# Patient Record
Sex: Male | Born: 1945 | Race: White | Hispanic: No
Health system: Southern US, Community
[De-identification: ages and names within clinical notes are randomized; demographics above are authoritative.]

## PROBLEM LIST (undated history)

## (undated) DIAGNOSIS — Z930 Tracheostomy status: Secondary | ICD-10-CM

## (undated) DIAGNOSIS — J9621 Acute and chronic respiratory failure with hypoxia: Secondary | ICD-10-CM

## (undated) DIAGNOSIS — F05 Delirium due to known physiological condition: Secondary | ICD-10-CM

## (undated) DIAGNOSIS — J15211 Pneumonia due to Methicillin susceptible Staphylococcus aureus: Secondary | ICD-10-CM

## (undated) DIAGNOSIS — I63549 Cerebral infarction due to unspecified occlusion or stenosis of unspecified cerebellar artery: Secondary | ICD-10-CM

---

## 2019-11-27 ENCOUNTER — Other Ambulatory Visit (HOSPITAL_COMMUNITY): Payer: Medicare Other

## 2019-11-27 ENCOUNTER — Inpatient Hospital Stay: Admission: RE | Admit: 2019-11-27 | Discharge: 2020-01-26 | Disposition: E | Payer: Medicare Other

## 2019-11-27 DIAGNOSIS — J9621 Acute and chronic respiratory failure with hypoxia: Secondary | ICD-10-CM | POA: Diagnosis present

## 2019-11-27 DIAGNOSIS — J9690 Respiratory failure, unspecified, unspecified whether with hypoxia or hypercapnia: Secondary | ICD-10-CM

## 2019-11-27 DIAGNOSIS — T17908A Unspecified foreign body in respiratory tract, part unspecified causing other injury, initial encounter: Secondary | ICD-10-CM

## 2019-11-27 DIAGNOSIS — J189 Pneumonia, unspecified organism: Secondary | ICD-10-CM

## 2019-11-27 DIAGNOSIS — I63549 Cerebral infarction due to unspecified occlusion or stenosis of unspecified cerebellar artery: Secondary | ICD-10-CM | POA: Diagnosis present

## 2019-11-27 DIAGNOSIS — Z9911 Dependence on respirator [ventilator] status: Secondary | ICD-10-CM

## 2019-11-27 DIAGNOSIS — Z931 Gastrostomy status: Secondary | ICD-10-CM

## 2019-11-27 DIAGNOSIS — Z930 Tracheostomy status: Secondary | ICD-10-CM

## 2019-11-27 DIAGNOSIS — J15211 Pneumonia due to Methicillin susceptible Staphylococcus aureus: Secondary | ICD-10-CM | POA: Diagnosis present

## 2019-11-27 DIAGNOSIS — F05 Delirium due to known physiological condition: Secondary | ICD-10-CM | POA: Diagnosis present

## 2019-11-27 HISTORY — DX: Tracheostomy status: Z93.0

## 2019-11-27 HISTORY — DX: Delirium due to known physiological condition: F05

## 2019-11-27 HISTORY — DX: Acute and chronic respiratory failure with hypoxia: J96.21

## 2019-11-27 HISTORY — DX: Cerebral infarction due to unspecified occlusion or stenosis of unspecified cerebellar artery: I63.549

## 2019-11-27 HISTORY — DX: Pneumonia due to methicillin susceptible Staphylococcus aureus: J15.211

## 2019-11-27 MED ORDER — IOHEXOL 300 MG/ML  SOLN
50.0000 mL | Freq: Once | INTRAMUSCULAR | Status: AC | PRN
  Administered 2019-11-27: 50 mL

## 2019-11-28 DIAGNOSIS — J15211 Pneumonia due to Methicillin susceptible Staphylococcus aureus: Secondary | ICD-10-CM | POA: Diagnosis present

## 2019-11-28 DIAGNOSIS — J9621 Acute and chronic respiratory failure with hypoxia: Secondary | ICD-10-CM | POA: Diagnosis not present

## 2019-11-28 DIAGNOSIS — I63549 Cerebral infarction due to unspecified occlusion or stenosis of unspecified cerebellar artery: Secondary | ICD-10-CM | POA: Diagnosis not present

## 2019-11-28 DIAGNOSIS — F05 Delirium due to known physiological condition: Secondary | ICD-10-CM

## 2019-11-28 DIAGNOSIS — Z930 Tracheostomy status: Secondary | ICD-10-CM

## 2019-11-28 LAB — COMPREHENSIVE METABOLIC PANEL
ALT: 26 U/L (ref 0–44)
AST: 28 U/L (ref 15–41)
Albumin: 2.8 g/dL — ABNORMAL LOW (ref 3.5–5.0)
Alkaline Phosphatase: 53 U/L (ref 38–126)
Anion gap: 9 (ref 5–15)
BUN: 33 mg/dL — ABNORMAL HIGH (ref 8–23)
CO2: 29 mmol/L (ref 22–32)
Calcium: 9 mg/dL (ref 8.9–10.3)
Chloride: 102 mmol/L (ref 98–111)
Creatinine, Ser: 0.56 mg/dL — ABNORMAL LOW (ref 0.61–1.24)
GFR calc Af Amer: >60 mL/min (ref >60)
GFR calc non Af Amer: >60 mL/min (ref >60)
Glucose, Bld: 124 mg/dL — ABNORMAL HIGH (ref 70–99)
Potassium: 4.6 mmol/L (ref 3.5–5.1)
Sodium: 140 mmol/L (ref 135–145)
Total Bilirubin: 1.1 mg/dL (ref 0.3–1.2)
Total Protein: 6.9 g/dL (ref 6.5–8.1)

## 2019-11-28 LAB — URINALYSIS, ROUTINE W REFLEX MICROSCOPIC
Bilirubin Urine: NEGATIVE
Glucose, UA: NEGATIVE mg/dL
Ketones, ur: NEGATIVE mg/dL
Nitrite: POSITIVE — AB
Protein, ur: 100 mg/dL — AB
RBC / HPF: >50 RBC/hpf — ABNORMAL HIGH (ref 0–5)
Specific Gravity, Urine: 1.025 (ref 1.005–1.030)
WBC, UA: >50 WBC/hpf — ABNORMAL HIGH (ref 0–5)
pH: 6 (ref 5.0–8.0)

## 2019-11-28 LAB — CBC
HCT: 33.6 % — ABNORMAL LOW (ref 39.0–52.0)
Hemoglobin: 10.6 g/dL — ABNORMAL LOW (ref 13.0–17.0)
MCH: 30.5 pg (ref 26.0–34.0)
MCHC: 31.5 g/dL (ref 30.0–36.0)
MCV: 96.8 fL (ref 80.0–100.0)
Platelets: 292 10*3/uL (ref 150–400)
RBC: 3.47 MIL/uL — ABNORMAL LOW (ref 4.22–5.81)
RDW: 16.6 % — ABNORMAL HIGH (ref 11.5–15.5)
WBC: 13.1 10*3/uL — ABNORMAL HIGH (ref 4.0–10.5)
nRBC: 0 % (ref 0.0–0.2)

## 2019-11-28 LAB — BLOOD GAS, ARTERIAL
Acid-Base Excess: 5.9 mmol/L — ABNORMAL HIGH (ref 0.0–2.0)
Bicarbonate: 30.1 mmol/L — ABNORMAL HIGH (ref 20.0–28.0)
FIO2: 40
O2 Saturation: 98.6 %
Patient temperature: 37
pCO2 arterial: 45.5 mmHg (ref 32.0–48.0)
pH, Arterial: 7.436 (ref 7.350–7.450)
pO2, Arterial: 97.3 mmHg (ref 83.0–108.0)

## 2019-11-28 LAB — TSH: TSH: 3.054 u[IU]/mL (ref 0.350–4.500)

## 2019-11-28 MED ORDER — GENERIC EXTERNAL MEDICATION
5.00 | Status: DC
Start: ? — End: 2019-11-28

## 2019-11-28 MED ORDER — VALPROIC ACID 250 MG/5ML PO SOLN
500.00 | ORAL | Status: DC
Start: 2019-11-27 — End: 2019-11-28

## 2019-11-28 MED ORDER — CHLORHEXIDINE GLUCONATE 0.12 % MT SOLN
5.00 | OROMUCOSAL | Status: DC
Start: 2019-11-27 — End: 2019-11-28

## 2019-11-28 MED ORDER — ALBUTEROL SULFATE (2.5 MG/3ML) 0.083% IN NEBU
2.50 | INHALATION_SOLUTION | RESPIRATORY_TRACT | Status: DC
Start: ? — End: 2019-11-28

## 2019-11-28 MED ORDER — ASPIRIN 81 MG PO CHEW
81.00 | CHEWABLE_TABLET | ORAL | Status: DC
Start: 2019-11-28 — End: 2019-11-28

## 2019-11-28 MED ORDER — MELATONIN 3 MG PO TABS
6.00 | ORAL_TABLET | ORAL | Status: DC
Start: 2019-11-28 — End: 2019-11-28

## 2019-11-28 MED ORDER — SENNOSIDES 8.8 MG/5ML PO SYRP
10.00 | ORAL_SOLUTION | ORAL | Status: DC
Start: 2019-11-27 — End: 2019-11-28

## 2019-11-28 MED ORDER — OLANZAPINE 7.5 MG PO TABS
15.00 | ORAL_TABLET | ORAL | Status: DC
Start: 2019-11-28 — End: 2019-11-28

## 2019-11-28 MED ORDER — TRAZODONE HCL 50 MG PO TABS
75.00 | ORAL_TABLET | ORAL | Status: DC
Start: 2019-11-27 — End: 2019-11-28

## 2019-11-28 MED ORDER — OLANZAPINE 5 MG PO TABS
5.00 | ORAL_TABLET | ORAL | Status: DC
Start: 2019-11-28 — End: 2019-11-28

## 2019-11-28 MED ORDER — POLYETHYLENE GLYCOL 3350 17 GM/SCOOP PO POWD
17.00 | ORAL | Status: DC
Start: 2019-11-28 — End: 2019-11-28

## 2019-11-28 MED ORDER — TAMSULOSIN HCL 0.4 MG PO CAPS
0.40 | ORAL_CAPSULE | ORAL | Status: DC
Start: 2019-11-27 — End: 2019-11-28

## 2019-11-28 MED ORDER — ENOXAPARIN SODIUM 40 MG/0.4ML ~~LOC~~ SOLN
40.00 | SUBCUTANEOUS | Status: DC
Start: 2019-11-28 — End: 2019-11-28

## 2019-11-28 MED ORDER — HYDROMORPHONE HCL 1 MG/ML IJ SOLN
1.00 | INTRAMUSCULAR | Status: DC
Start: ? — End: 2019-11-28

## 2019-11-28 MED ORDER — ATORVASTATIN CALCIUM 80 MG PO TABS
80.00 | ORAL_TABLET | ORAL | Status: DC
Start: 2019-11-28 — End: 2019-11-28

## 2019-11-28 NOTE — Consult Note (Signed)
Pulmonary [place]  Date of Service: 11/28/2019  PULMONARY CRITICAL CARE Melvin Howard  TMA:[id number]  DOB: 1946-07-21   DOA: 12-26-2019  Referring Physician: Merton Border, MD  HPI: Melvin Howard is a 74 y.o. male seen for follow up of Acute on Chronic Respiratory Failure.  Patient has multiple medical problems including history of stroke cerebellar infarction pneumonia respiratory failure tracheostomy acute renal failure who was admitted to [facility] with projectile vomiting severe headache.  Patient arrived into the ED with dysphagia dysarthria on waking.  Patient also was noted to have significant ataxia of his instability of his gait.  Was evaluated with a CT and showed a large left cerebellar infarction.  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  Patient was admitted to the neurosurgical ICU and was started on aspirin.  Hospital course patient was intubated and placed on the ventilator had a tracheostomy done on December 7 patient had initially been intubated for airway protection but did not tolerate any weaning trials subsequently.  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  ENT did a biopsy was found to have benign mucosa.  Other complications included development of MSSA pneumonia and tracheobronchitis patient also had been noted to be hypotensive.  Chest x-ray had shown opacification on the presentation of the pneumonia cultures are growing 3+ MSSA pneumonia.  Patient also suffered an acute renal failure along with hypernatremia which apparently did improve.  Other issues have been behavior always been having multifocal delirium agitation and also there is a concern that he may have possibility of dementia.  He is now transferred here for further management and possibility of weaning.   At the time he was evaluated patient is obviously confused and agitated and is on the ventilator on 40% FiO2.  Review of Systems:  ROS performed and is unremarkable other than noted above.  Past medical history: Acute stroke involving cerebellum Tracheostomy Respiratory failure Delirium agitation Pneumonia  Past surgical history: Tracheostomy PEG placement  Social history: Unknown if tobacco smoker or alcohol abuse  Family history: Noncontributory   Medications: Reviewed on Rounds  Physical Exam:  Vitals: Temperature 98.9 pulse 81 respiratory rate 13 blood pressure is 118/73 saturations 98%  Ventilator Settings Mode of ventilation assist control FiO2 40% tidal volume 540 PEEP 7  . General: Comfortable at this time . Eyes: Grossly normal lids, irises & conjunctiva . ENT: grossly tongue is normal . Neck: no obvious mass . Cardiovascular: S1-S2 normal no gallop or rub . Respiratory: No rhonchi no rales are noted at this time . Abdomen: Soft and nontender . Skin: no rash seen on limited exam . Musculoskeletal: not rigid . Psychiatric:unable to assess . Neurologic: no seizure no involuntary movements         Labs on Admission:  Basic Metabolic Panel: Recent Labs  Lab December 26, 2019 2341  NA 140  K 4.6  CL 102  CO2 29  GLUCOSE 124*  BUN 33*  CREATININE 0.56*  CALCIUM 9.0    Recent Labs  Lab 11/28/19 0245  PHART 7.436  PCO2ART 45.5  PO2ART 97.3  HCO3 30.1*  O2SAT 98.6    Liver Function Tests: Recent Labs  Lab December 26, 2019 2341  AST 28  ALT 26  ALKPHOS 53  BILITOT 1.1  PROT 6.9  ALBUMIN 2.8*   No results for input(s):  LIPASE, AMYLASE in the last 168 hours. No results for input(s): AMMONIA in the last 168 hours.  CBC: Recent Labs  Lab 12/19/19 2341  WBC 13.1*  HGB 10.6*  HCT 33.6*  MCV 96.8  PLT 292    Cardiac Enzymes: No results for input(s): CKTOTAL, CKMB, CKMBINDEX, TROPONINI in the last 168 hours.  BNP (last 3 results) No results for  input(s): BNP in the last 8760 hours.  ProBNP (last 3 results) No results for input(s): PROBNP in the last 8760 hours.   Radiological Exams on Admission: DG ABDOMEN PEG TUBE LOCATION  Result Date: 12/19/19 CLINICAL DATA:  Initial evaluation for PEG tube placement. EXAM: ABDOMEN - 1 VIEW COMPARISON:  None. FINDINGS: Percutaneous gastrostomy tube in place. Contrast material has been instilled via the tube in is seen filling the stomach and proximal duodenum. Additional contrast material seen throughout the colon. No extraluminal contrast to suggest leak or malpositioning. Visualized bowel gas pattern is nonobstructive. IMPRESSION: 1. Percutaneous G-tube in place within the stomach. No evidence for malpositioning or leak. 2. Nonobstructive bowel gas pattern. Electronically Signed   By: Rise Mu M.D.   On: 2019/12/19 23:41   DG CHEST PORT 1 VIEW  Result Date: 12/19/19 CLINICAL DATA:  Initial evaluation for acute respiratory failure. EXAM: PORTABLE CHEST 1 VIEW COMPARISON:  None. FINDINGS: Tracheostomy tube in place, in appropriate position overlying the upper airway. Cardiomegaly. Mediastinal silhouette within normal limits. Aortic atherosclerosis. Shallow lung inflation with elevation left hemidiaphragm. Patchy opacity within the retrocardiac left lower lobe could reflect atelectasis or consolidation. Possible small left pleural effusion noted. Right lung is largely clear. No pneumothorax. No acute osseous finding. IMPRESSION: 1. Tracheostomy tube in appropriate position overlying the upper airway. 2. Shallow lung inflation with associated patchy retrocardiac left lower lobe opacity, which could reflect atelectasis or consolidation. 3. Question small left pleural effusion. 4. Cardiomegaly without edema. 5.  Aortic Atherosclerosis (ICD10-I70.0). Electronically Signed   By: Rise Mu M.D.   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 Acute on chronic respiratory failure with hypoxia or at this time patient is on the ventilator we did check a RSB I not good we will have to reassess again later today and then tomorrow.  Patient is quite agitated and confused this may limit as any being able to wean him. 2. MSSA pneumonia treated at the other facility current chest x-ray reveals some mild retrocardiac left lower lobe opacity concerning for pneumonia right now patient does not have a fever we will continue to follow along. 3. Tracheostomy right now remains in place we will continue with supportive care.  He did have some vocal cord abnormalities which will need to be followed along. 4. Confusion delirium patient has been on Zyprexa we will continue with supportive care. 5. Acute cerebellar infarction patient was noted to have vertebral arterial dissection also we will continue with supportive care physical therapy to evaluate patient  I have personally seen and evaluated the patient, evaluated laboratory and imaging results, formulated the assessment and plan and placed orders. The Patient requires high complexity decision making for assessment and support.  Case was discussed on Rounds with the Respiratory Therapy Staff Time Spent  Kamyra Schroeck A  Humphrey Rolls, MD [facility] Pulmonary Critical Care Medicine Sleep Medicine

## 2019-11-28 DEATH — deceased

## 2019-11-29 DIAGNOSIS — J15211 Pneumonia due to Methicillin susceptible Staphylococcus aureus: Secondary | ICD-10-CM | POA: Diagnosis not present

## 2019-11-29 DIAGNOSIS — F05 Delirium due to known physiological condition: Secondary | ICD-10-CM | POA: Diagnosis not present

## 2019-11-29 DIAGNOSIS — I63549 Cerebral infarction due to unspecified occlusion or stenosis of unspecified cerebellar artery: Secondary | ICD-10-CM | POA: Diagnosis not present

## 2019-11-29 DIAGNOSIS — J9621 Acute and chronic respiratory failure with hypoxia: Secondary | ICD-10-CM | POA: Diagnosis not present

## 2019-11-29 NOTE — Progress Notes (Signed)
Pulmonary Critical Care Medicine [facility] [place]   PULMONARY CRITICAL CARE SERVICE  PROGRESS NOTE  Date of Service: 11/29/2019  Randol Zumstein  VEL:[id number]  DOB: March 12, 1946   DOA: 11/26/2019  Referring Physician: Carron Curie, MD  HPI: Melvin Howard is a 74 y.o. male seen for follow up of Acute on Chronic Respiratory Failure.  Patient currently is on 30% FiO2 on full support on the ventilator right now is on assist control mode.  Respiratory therapy reports that he continues to have issues with agitation and has not been able to do well with the pressure support weaning attempts today.  Patient still remains significantly confused  Medications: Reviewed on Rounds  Physical Exam:  Vitals: Temperature 99.6 pulse 93 respiratory rate 20 blood pressure is 132/75 saturations 98%  Ventilator Settings mode ventilation assist control FiO2 30% tidal volume 450 PEEP 5  . General: Comfortable at this time . Eyes: Grossly normal lids, irises & conjunctiva . ENT: grossly tongue is normal . Neck: no obvious mass . Cardiovascular: S1 S2 normal no gallop . Respiratory: No rhonchi coarse breath sounds are noted at this time . Abdomen: soft . Skin: no rash seen on limited exam . Musculoskeletal: not rigid . Psychiatric:unable to assess . Neurologic: no seizure no involuntary movements         Lab Data:   Basic Metabolic Panel: Recent Labs  Lab 11/12/2019 2341  NA 140  K 4.6  CL 102  CO2 29  GLUCOSE 124*  BUN 33*  CREATININE 0.56*  CALCIUM 9.0    ABG: Recent Labs  Lab 11/28/19 0245  PHART 7.436  PCO2ART 45.5  PO2ART 97.3  HCO3 30.1*  O2SAT 98.6    Liver Function Tests: Recent Labs  Lab 11/01/2019 2341  AST 28  ALT 26  ALKPHOS 53  BILITOT 1.1  PROT 6.9  ALBUMIN 2.8*   No results for input(s): LIPASE, AMYLASE in the last 168 hours. No results for input(s): AMMONIA in the last 168 hours.  CBC: Recent Labs  Lab 11/09/2019 2341  WBC 13.1*   HGB 10.6*  HCT 33.6*  MCV 96.8  PLT 292    Cardiac Enzymes: No results for input(s): CKTOTAL, CKMB, CKMBINDEX, TROPONINI in the last 168 hours.  BNP (last 3 results) No results for input(s): BNP in the last 8760 hours.  ProBNP (last 3 results) No results for input(s): PROBNP in the last 8760 hours.  Radiological Exams: DG ABDOMEN PEG TUBE LOCATION  Result Date: 11/16/2019 CLINICAL DATA:  Initial evaluation for PEG tube placement. EXAM: ABDOMEN - 1 VIEW COMPARISON:  None. FINDINGS: Percutaneous gastrostomy tube in place. Contrast material has been instilled via the tube in is seen filling the stomach and proximal duodenum. Additional contrast material seen throughout the colon. No extraluminal contrast to suggest leak or malpositioning. Visualized bowel gas pattern is nonobstructive. IMPRESSION: 1. Percutaneous G-tube in place within the stomach. No evidence for malpositioning or leak. 2. Nonobstructive bowel gas pattern. Electronically Signed   By: Rise Mu M.D.   On: 11/08/2019 23:41   DG CHEST PORT 1 VIEW  Result Date: 11/19/2019 CLINICAL DATA:  Initial evaluation for acute respiratory failure. EXAM: PORTABLE CHEST 1 VIEW COMPARISON:  None. FINDINGS: Tracheostomy tube in place, in appropriate position overlying the upper airway. Cardiomegaly. Mediastinal silhouette within normal limits. Aortic atherosclerosis. Shallow lung inflation with elevation left hemidiaphragm. Patchy opacity within the retrocardiac left lower lobe could reflect atelectasis or consolidation. Possible small left pleural effusion noted. Right lung is largely  clear. No pneumothorax. No acute osseous finding. IMPRESSION: 1. Tracheostomy tube in appropriate position overlying the upper airway. 2. Shallow lung inflation with associated patchy retrocardiac left lower lobe opacity, which could reflect atelectasis or consolidation. 3. Question small left pleural effusion. 4. Cardiomegaly without edema. 5.  Aortic  Atherosclerosis (ICD10-I70.0). Electronically Signed   By: Jeannine Boga M.D.   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 Acute on chronic respiratory failure hypoxia patient continues to be confused agitated not tolerating attempts at weaning currently on assist control mode with an FiO2 of 30% tidal volume 450 PEEP 5 requiring full support from the ventilator. 2. MSSA pneumonia treated last chest x-ray that was done showing shallow lung expansion as well as retrocardiac opacities. 3. Delirium no change we will continue with present management medications reviewed with the primary care team 4. Cerebellar infarction at baseline we will continue present therapy patient needs physical therapy assessment 5. Tracheostomy remains in place for airway protection   I have personally seen and evaluated the patient, evaluated laboratory and imaging results, formulated the assessment and plan and placed orders. The Patient requires high complexity decision making with multiple systems involvement.  Rounds were done with the Respiratory Therapy Director and Staff therapists and discussed with nursing staff also.  Time 35 minutes  Allyne Gee, MD [facility] Pulmonary Critical Care Medicine Sleep Medicine

## 2019-11-30 DIAGNOSIS — I63549 Cerebral infarction due to unspecified occlusion or stenosis of unspecified cerebellar artery: Secondary | ICD-10-CM | POA: Diagnosis not present

## 2019-11-30 DIAGNOSIS — F05 Delirium due to known physiological condition: Secondary | ICD-10-CM | POA: Diagnosis not present

## 2019-11-30 DIAGNOSIS — J15211 Pneumonia due to Methicillin susceptible Staphylococcus aureus: Secondary | ICD-10-CM | POA: Diagnosis not present

## 2019-11-30 DIAGNOSIS — J9621 Acute and chronic respiratory failure with hypoxia: Secondary | ICD-10-CM | POA: Diagnosis not present

## 2019-11-30 LAB — URINE CULTURE: Culture: >=100000 — AB

## 2019-11-30 NOTE — Progress Notes (Signed)
Pulmonary Critical Care Medicine [facility] [place]   PULMONARY CRITICAL CARE SERVICE  PROGRESS NOTE  Date of Service: 11/30/2019  Melvin Howard  BWL:[id number]  DOB: 05-05-1946   DOA: 11/19/2019  Referring Physician: Carron Curie, MD  HPI: Melvin Howard is a 74 y.o. male seen for follow up of Acute on Chronic Respiratory Failure.  Patient currently is on full support on assist control mode has been more agitated and anxious.  Was attempted on spontaneous breathing trial however patient did not tolerate it.  Right now is on assist control still on 30% FiO2 tidal volumes are good however patient gets increase agitation on attempts to wean  Medications: Reviewed on Rounds  Physical Exam:  Vitals: Temperature 98.6 pulse 72 respiratory rate 30 blood pressure is 131/70 saturations 97%  Ventilator Settings mode ventilation assist control FiO2 30% tidal volume 644 PEEP 5  . General: Comfortable at this time . Eyes: Grossly normal lids, irises & conjunctiva . ENT: grossly tongue is normal . Neck: no obvious mass . Cardiovascular: S1 S2 normal no gallop . Respiratory: No rhonchi very coarse breath sounds noted . Abdomen: soft . Skin: no rash seen on limited exam . Musculoskeletal: not rigid . Psychiatric:unable to assess . Neurologic: no seizure no involuntary movements         Lab Data:   Basic Metabolic Panel: Recent Labs  Lab 11/07/2019 2341  NA 140  K 4.6  CL 102  CO2 29  GLUCOSE 124*  BUN 33*  CREATININE 0.56*  CALCIUM 9.0    ABG: Recent Labs  Lab 11/28/19 0245  PHART 7.436  PCO2ART 45.5  PO2ART 97.3  HCO3 30.1*  O2SAT 98.6    Liver Function Tests: Recent Labs  Lab 11/06/2019 2341  AST 28  ALT 26  ALKPHOS 53  BILITOT 1.1  PROT 6.9  ALBUMIN 2.8*   No results for input(s): LIPASE, AMYLASE in the last 168 hours. No results for input(s): AMMONIA in the last 168 hours.  CBC: Recent Labs  Lab 11/02/2019 2341  WBC 13.1*  HGB 10.6*   HCT 33.6*  MCV 96.8  PLT 292    Cardiac Enzymes: No results for input(s): CKTOTAL, CKMB, CKMBINDEX, TROPONINI in the last 168 hours.  BNP (last 3 results) No results for input(s): BNP in the last 8760 hours.  ProBNP (last 3 results) No results for input(s): PROBNP in the last 8760 hours.  Radiological Exams: No results found.  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 Acute on chronic respiratory failure with hypoxia patient currently has been on full support on assist control mode right now is on an FiO2 of 30% patient has been failing spontaneous breathing trials consistently.  Last chest x-ray revealed some retrocardiac density.  Also of significance was the size of the patient's heart would suggest getting a echocardiogram patient did not have an echocardiogram done at the other facility. 2. MSSA pneumonia patient has been treated I would recommend getting a follow-up chest x-ray as patient is still not able to tolerate weaning. 3. Delirium medical management 4. Cerebellar infarction no change supportive care therapy as tolerated.   I have personally seen and evaluated the patient, evaluated laboratory and imaging results, formulated the assessment and plan and placed orders.  The Patient requires high complexity decision making with multiple systems involvement.  Rounds were done with the Respiratory Therapy Director and Staff therapists and discussed with nursing staff also.  Time 35 minutes review of the patient's records from previous hospitalization  Allyne Gee, MD [place] [facility]

## 2019-12-01 DIAGNOSIS — J15211 Pneumonia due to Methicillin susceptible Staphylococcus aureus: Secondary | ICD-10-CM | POA: Diagnosis not present

## 2019-12-01 DIAGNOSIS — J9621 Acute and chronic respiratory failure with hypoxia: Secondary | ICD-10-CM | POA: Diagnosis not present

## 2019-12-01 DIAGNOSIS — F05 Delirium due to known physiological condition: Secondary | ICD-10-CM | POA: Diagnosis not present

## 2019-12-01 DIAGNOSIS — I63549 Cerebral infarction due to unspecified occlusion or stenosis of unspecified cerebellar artery: Secondary | ICD-10-CM | POA: Diagnosis not present

## 2019-12-01 LAB — BASIC METABOLIC PANEL
Anion gap: 11 (ref 5–15)
BUN: 22 mg/dL (ref 8–23)
CO2: 25 mmol/L (ref 22–32)
Calcium: 8.3 mg/dL — ABNORMAL LOW (ref 8.9–10.3)
Chloride: 102 mmol/L (ref 98–111)
Creatinine, Ser: 0.56 mg/dL — ABNORMAL LOW (ref 0.61–1.24)
GFR calc Af Amer: >60 mL/min (ref >60)
GFR calc non Af Amer: >60 mL/min (ref >60)
Glucose, Bld: 160 mg/dL — ABNORMAL HIGH (ref 70–99)
Potassium: 5.1 mmol/L (ref 3.5–5.1)
Sodium: 138 mmol/L (ref 135–145)

## 2019-12-01 LAB — CULTURE, RESPIRATORY W GRAM STAIN

## 2019-12-01 LAB — CBC
HCT: 34.2 % — ABNORMAL LOW (ref 39.0–52.0)
Hemoglobin: 10.9 g/dL — ABNORMAL LOW (ref 13.0–17.0)
MCH: 30.6 pg (ref 26.0–34.0)
MCHC: 31.9 g/dL (ref 30.0–36.0)
MCV: 96.1 fL (ref 80.0–100.0)
Platelets: 280 10*3/uL (ref 150–400)
RBC: 3.56 MIL/uL — ABNORMAL LOW (ref 4.22–5.81)
RDW: 16.1 % — ABNORMAL HIGH (ref 11.5–15.5)
WBC: 10.3 10*3/uL (ref 4.0–10.5)
nRBC: 0 % (ref 0.0–0.2)

## 2019-12-01 LAB — MAGNESIUM: Magnesium: 2.2 mg/dL (ref 1.7–2.4)

## 2019-12-01 NOTE — Progress Notes (Addendum)
Pulmonary Critical Care Medicine [facility] [place]   PULMONARY CRITICAL CARE SERVICE  PROGRESS NOTE  Date of Service: 12/01/2019  Melvin Howard  MOQ:[id number]  DOB: February 24, 1946   DOA: 10/29/2019  Referring Physician: Carron Curie, MD  HPI: Melvin Howard is a 74 y.o. male seen for follow up of Acute on Chronic Respiratory Failure.  Patient remains confused agitated respiratory therapist was trying to wean him however patient was not able to cooperate with attempts at weaning.  Patient was placed on spontaneous breathing trial earlier today and did not tolerate it.  Medications: Reviewed on Rounds  Physical Exam:  Vitals: Temperature 97.0 pulse 86 respiratory 22 blood pressure 140/89 saturations 94%  Ventilator Settings mode ventilation assist control FiO2 30% PEEP 5 tidal line 450  . General: Comfortable at this time . Eyes: Grossly normal lids, irises & conjunctiva . ENT: grossly tongue is normal . Neck: no obvious mass . Cardiovascular: S1 S2 normal no gallop . Respiratory: No rhonchi coarse breath sounds are noted at this time . Abdomen: soft . Skin: no rash seen on limited exam . Musculoskeletal: not rigid . Psychiatric:unable to assess . Neurologic: no seizure no involuntary movements         Lab Data:   Basic Metabolic Panel: Recent Labs  Lab 11/04/2019 2341 12/01/19 0731  NA 140 138  K 4.6 5.1  CL 102 102  CO2 29 25  GLUCOSE 124* 160*  BUN 33* 22  CREATININE 0.56* 0.56*  CALCIUM 9.0 8.3*  MG  --  2.2    ABG: Recent Labs  Lab 11/28/19 0245  PHART 7.436  PCO2ART 45.5  PO2ART 97.3  HCO3 30.1*  O2SAT 98.6    Liver Function Tests: Recent Labs  Lab 11/11/2019 2341  AST 28  ALT 26  ALKPHOS 53  BILITOT 1.1  PROT 6.9  ALBUMIN 2.8*   No results for input(s): LIPASE, AMYLASE in the last 168 hours. No results for input(s): AMMONIA in the last 168 hours.  CBC: Recent Labs  Lab 11/19/2019 2341 12/01/19 0731  WBC 13.1* 10.3   HGB 10.6* 10.9*  HCT 33.6* 34.2*  MCV 96.8 96.1  PLT 292 280    Cardiac Enzymes: No results for input(s): CKTOTAL, CKMB, CKMBINDEX, TROPONINI in the last 168 hours.  BNP (last 3 results) No results for input(s): BNP in the last 8760 hours.  ProBNP (last 3 results) No results for input(s): PROBNP in the last 8760 hours.  Radiological Exams: No results found.  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 Acute on chronic respiratory failure hypoxia the plan is to continue with full support on the ventilator patient has been attempted at spontaneous breathing trial which the patient did not tolerate. 2. MSSA pneumonia treated resolved 3. Tracheostomy remains in place 4. Delirium needs better control of the neurological issues 5. Cerebellar infarction no change from a neurological perspective we will continue with present management   I have personally seen and evaluated the patient, evaluated laboratory and imaging results, formulated the assessment and plan and placed orders. The Patient requires high complexity decision making with multiple systems involvement.  Rounds were done with the Respiratory Therapy Director and Staff therapists and discussed with nursing staff also.  Yevonne Pax, MD  [facility] Pulmonary Critical Care Medicine Sleep Medicine

## 2019-12-02 DIAGNOSIS — J9621 Acute and chronic respiratory failure with hypoxia: Secondary | ICD-10-CM | POA: Diagnosis not present

## 2019-12-02 DIAGNOSIS — F05 Delirium due to known physiological condition: Secondary | ICD-10-CM | POA: Diagnosis not present

## 2019-12-02 DIAGNOSIS — J15211 Pneumonia due to Methicillin susceptible Staphylococcus aureus: Secondary | ICD-10-CM | POA: Diagnosis not present

## 2019-12-02 DIAGNOSIS — I63549 Cerebral infarction due to unspecified occlusion or stenosis of unspecified cerebellar artery: Secondary | ICD-10-CM | POA: Diagnosis not present

## 2019-12-02 NOTE — Progress Notes (Signed)
Pulmonary Critical Care Medicine [facility] [place]   PULMONARY CRITICAL CARE SERVICE  PROGRESS NOTE  Date of Service: 12/02/2019  Destin Vinsant  MEQ:[id number]  DOB: 03/11/1946   DOA: 11/05/2019  Referring Physician: Carron Curie, MD  HPI: Melvin Howard is a 74 y.o. male seen for follow up of Acute on Chronic Respiratory Failure.  Patient is on full support right now on assist control mode has been on 35% FiO2 patient was attempted on spontaneous breathing trial but did not tolerated so was placed back on the ventilator.  Medications: Reviewed on Rounds  Physical Exam:  Vitals: Temperature 98.8 pulse 88 respiratory rate 30 blood pressure is 129/71 saturations 92%  Ventilator Settings mode of ventilation assist control FiO2 is 35% tidal line 450 PEEP 5  . General: Comfortable at this time . Eyes: Grossly normal lids, irises & conjunctiva . ENT: grossly tongue is normal . Neck: no obvious mass . Cardiovascular: S1 S2 normal no gallop . Respiratory: Scattered rhonchi expansion is equal . Abdomen: soft . Skin: no rash seen on limited exam . Musculoskeletal: not rigid . Psychiatric:unable to assess . Neurologic: no seizure no involuntary movements         Lab Data:   Basic Metabolic Panel: Recent Labs  Lab 11/04/2019 2341 12/01/19 0731  NA 140 138  K 4.6 5.1  CL 102 102  CO2 29 25  GLUCOSE 124* 160*  BUN 33* 22  CREATININE 0.56* 0.56*  CALCIUM 9.0 8.3*  MG  --  2.2    ABG: Recent Labs  Lab 11/28/19 0245  PHART 7.436  PCO2ART 45.5  PO2ART 97.3  HCO3 30.1*  O2SAT 98.6    Liver Function Tests: Recent Labs  Lab 11/05/2019 2341  AST 28  ALT 26  ALKPHOS 53  BILITOT 1.1  PROT 6.9  ALBUMIN 2.8*   No results for input(s): LIPASE, AMYLASE in the last 168 hours. No results for input(s): AMMONIA in the last 168 hours.  CBC: Recent Labs  Lab 11/02/2019 2341 12/01/19 0731  WBC 13.1* 10.3  HGB 10.6* 10.9*  HCT 33.6* 34.2*  MCV 96.8  96.1  PLT 292 280    Cardiac Enzymes: No results for input(s): CKTOTAL, CKMB, CKMBINDEX, TROPONINI in the last 168 hours.  BNP (last 3 results) No results for input(s): BNP in the last 8760 hours.  ProBNP (last 3 results) No results for input(s): PROBNP in the last 8760 hours.  Radiological Exams: No results found.  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 Acute on chronic respiratory failure hypoxia plan is to continue with full support on the ventilator for now this afternoon we will recheck the spontaneous breathing trial to see if patient is able to pass 2. MSSA pneumonia treated we will continue with supportive care 3. Tracheostomy remains in place 4. Delirium no change needs medication adjustment which is being worked on by the PCP 5. Cerebellar infarction no changes supportive care therapy to work with patient as he is able to tolerate   I have personally seen and evaluated the patient, evaluated laboratory and imaging results, formulated the assessment and plan and placed orders. The Patient requires high complexity decision making with multiple systems involvement.  Rounds were done with the Respiratory Therapy Director  and Staff therapists and discussed with nursing staff also.  Allyne Gee, MD [facility] Pulmonary Critical Care Medicine Sleep Medicine

## 2019-12-03 DIAGNOSIS — J9621 Acute and chronic respiratory failure with hypoxia: Secondary | ICD-10-CM | POA: Diagnosis not present

## 2019-12-03 DIAGNOSIS — J15211 Pneumonia due to Methicillin susceptible Staphylococcus aureus: Secondary | ICD-10-CM | POA: Diagnosis not present

## 2019-12-03 DIAGNOSIS — F05 Delirium due to known physiological condition: Secondary | ICD-10-CM | POA: Diagnosis not present

## 2019-12-03 DIAGNOSIS — I63549 Cerebral infarction due to unspecified occlusion or stenosis of unspecified cerebellar artery: Secondary | ICD-10-CM | POA: Diagnosis not present

## 2019-12-03 NOTE — Progress Notes (Signed)
Pulmonary Critical Care Medicine [facility] [place]   PULMONARY CRITICAL CARE SERVICE  PROGRESS NOTE  Date of Service: 12/03/2019  Melvin Howard  NUU:[id number]  DOB: 10/03/1946   DOA: 11/01/2019  Referring Physician: Carron Curie, MD  HPI: Melvin Howard is a 74 y.o. male seen for follow up of Acute on Chronic Respiratory Failure.  Patient currently is on assist control mode on full support on 30% FiO2 still has issues with being restless  Medications: Reviewed on Rounds  Physical Exam:  Vitals: Temperature 99.3 pulse 110 respiratory rate 26 blood pressure is 104/85 saturations 93%  Ventilator Settings mode ventilation assist control FiO2 30% tidal volume 450 PEEP 5  . General: Comfortable at this time . Eyes: Grossly normal lids, irises & conjunctiva . ENT: grossly tongue is normal . Neck: no obvious mass . Cardiovascular: S1 S2 normal no gallop . Respiratory: Coarse breath sounds with few scattered rhonchi . Abdomen: soft . Skin: no rash seen on limited exam . Musculoskeletal: not rigid . Psychiatric:unable to assess . Neurologic: no seizure no involuntary movements         Lab Data:   Basic Metabolic Panel: Recent Labs  Lab 11/06/2019 2341 12/01/19 0731  NA 140 138  K 4.6 5.1  CL 102 102  CO2 29 25  GLUCOSE 124* 160*  BUN 33* 22  CREATININE 0.56* 0.56*  CALCIUM 9.0 8.3*  MG  --  2.2    ABG: Recent Labs  Lab 11/28/19 0245  PHART 7.436  PCO2ART 45.5  PO2ART 97.3  HCO3 30.1*  O2SAT 98.6    Liver Function Tests: Recent Labs  Lab 11/09/2019 2341  AST 28  ALT 26  ALKPHOS 53  BILITOT 1.1  PROT 6.9  ALBUMIN 2.8*   No results for input(s): LIPASE, AMYLASE in the last 168 hours. No results for input(s): AMMONIA in the last 168 hours.  CBC: Recent Labs  Lab 11/16/2019 2341 12/01/19 0731  WBC 13.1* 10.3  HGB 10.6* 10.9*  HCT 33.6* 34.2*  MCV 96.8 96.1  PLT 292 280    Cardiac Enzymes: No results for input(s): CKTOTAL,  CKMB, CKMBINDEX, TROPONINI in the last 168 hours.  BNP (last 3 results) No results for input(s): BNP in the last 8760 hours.  ProBNP (last 3 results) No results for input(s): PROBNP in the last 8760 hours.  Radiological Exams: No results found.  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 Acute on chronic respiratory failure with hypoxia patient is on assist control mode currently on 30% FiO2 good saturations are noted.  Is not been tolerating spontaneous breathing trials.  I will ask respiratory therapy to try to place the patient on 2. MSSA pneumonia treated we will continue with supportive care 3. Tracheostomy patient is on the ventilator. 4. Delirium medical management by primary care team 5. Cerebellar infarction probably contributing to his overall mental state we will continue with supportive care   I have personally seen and evaluated the patient, evaluated laboratory and imaging results, formulated the assessment and plan and placed orders. The Patient requires high complexity decision making with multiple systems involvement.  Rounds were done with the Respiratory Therapy Director and Staff therapists and discussed with nursing staff also.  Yevonne Pax, MD [facility]  Pulmonary Critical Care Medicine Sleep Medicine

## 2019-12-04 ENCOUNTER — Other Ambulatory Visit (HOSPITAL_COMMUNITY): Payer: Medicare Other

## 2019-12-04 DIAGNOSIS — J15211 Pneumonia due to Methicillin susceptible Staphylococcus aureus: Secondary | ICD-10-CM | POA: Diagnosis not present

## 2019-12-04 DIAGNOSIS — I63549 Cerebral infarction due to unspecified occlusion or stenosis of unspecified cerebellar artery: Secondary | ICD-10-CM | POA: Diagnosis not present

## 2019-12-04 DIAGNOSIS — J9621 Acute and chronic respiratory failure with hypoxia: Secondary | ICD-10-CM | POA: Diagnosis not present

## 2019-12-04 DIAGNOSIS — F05 Delirium due to known physiological condition: Secondary | ICD-10-CM | POA: Diagnosis not present

## 2019-12-04 LAB — PHOSPHORUS: Phosphorus: 3.7 mg/dL (ref 2.5–4.6)

## 2019-12-04 LAB — CBC
HCT: 32.5 % — ABNORMAL LOW (ref 39.0–52.0)
Hemoglobin: 10.2 g/dL — ABNORMAL LOW (ref 13.0–17.0)
MCH: 29.9 pg (ref 26.0–34.0)
MCHC: 31.4 g/dL (ref 30.0–36.0)
MCV: 95.3 fL (ref 80.0–100.0)
Platelets: 308 10*3/uL (ref 150–400)
RBC: 3.41 MIL/uL — ABNORMAL LOW (ref 4.22–5.81)
RDW: 15.9 % — ABNORMAL HIGH (ref 11.5–15.5)
WBC: 10.3 10*3/uL (ref 4.0–10.5)
nRBC: 0 % (ref 0.0–0.2)

## 2019-12-04 LAB — BASIC METABOLIC PANEL
Anion gap: 9 (ref 5–15)
BUN: 20 mg/dL (ref 8–23)
CO2: 30 mmol/L (ref 22–32)
Calcium: 8.6 mg/dL — ABNORMAL LOW (ref 8.9–10.3)
Chloride: 100 mmol/L (ref 98–111)
Creatinine, Ser: 0.73 mg/dL (ref 0.61–1.24)
GFR calc Af Amer: >60 mL/min (ref >60)
GFR calc non Af Amer: >60 mL/min (ref >60)
Glucose, Bld: 149 mg/dL — ABNORMAL HIGH (ref 70–99)
Potassium: 4 mmol/L (ref 3.5–5.1)
Sodium: 139 mmol/L (ref 135–145)

## 2019-12-04 LAB — MAGNESIUM: Magnesium: 2 mg/dL (ref 1.7–2.4)

## 2019-12-04 NOTE — Progress Notes (Signed)
Pulmonary Critical Care Medicine [facility] [place]   PULMONARY CRITICAL CARE SERVICE  PROGRESS NOTE  Date of Service: 12/04/2019  Melvin Howard  ELF:[id number]  DOB: Jul 19, 1946   DOA: December 04, 2019  Referring Physician: Carron Curie, MD  HPI: Melvin Howard is a 74 y.o. male seen for follow up of Acute on Chronic Respiratory Failure.  Patient is not tolerating the weaning at this time  Medications: Reviewed on Rounds  Physical Exam:  Vitals: Temperature 96.7 pulse 73 respiratory 20 blood pressure is 114/65 saturations 99%  Ventilator Settings mode ventilation assist control FiO2 35% tidal volume 450 PEEP 5  . General: Comfortable at this time . Eyes: Grossly normal lids, irises & conjunctiva . ENT: grossly tongue is normal . Neck: no obvious mass . Cardiovascular: S1 S2 normal no gallop . Respiratory: Scattered rhonchi expansion is equal . Abdomen: soft . Skin: no rash seen on limited exam . Musculoskeletal: not rigid . Psychiatric:unable to assess . Neurologic: no seizure no involuntary movements         Lab Data:   Basic Metabolic Panel: Recent Labs  Lab 12/04/19 2341 12/01/19 0731 12/04/19 0655  NA 140 138 139  K 4.6 5.1 4.0  CL 102 102 100  CO2 29 25 30   GLUCOSE 124* 160* 149*  BUN 33* 22 20  CREATININE 0.56* 0.56* 0.73  CALCIUM 9.0 8.3* 8.6*  MG  --  2.2 2.0  PHOS  --   --  3.7    ABG: Recent Labs  Lab 11/28/19 0245  PHART 7.436  PCO2ART 45.5  PO2ART 97.3  HCO3 30.1*  O2SAT 98.6    Liver Function Tests: Recent Labs  Lab 12-04-2019 2341  AST 28  ALT 26  ALKPHOS 53  BILITOT 1.1  PROT 6.9  ALBUMIN 2.8*   No results for input(s): LIPASE, AMYLASE in the last 168 hours. No results for input(s): AMMONIA in the last 168 hours.  CBC: Recent Labs  Lab 2019/12/04 2341 12/01/19 0731 12/04/19 0655  WBC 13.1* 10.3 10.3  HGB 10.6* 10.9* 10.2*  HCT 33.6* 34.2* 32.5*  MCV 96.8 96.1 95.3  PLT 292 280 308    Cardiac  Enzymes: No results for input(s): CKTOTAL, CKMB, CKMBINDEX, TROPONINI in the last 168 hours.  BNP (last 3 results) No results for input(s): BNP in the last 8760 hours.  ProBNP (last 3 results) No results for input(s): PROBNP in the last 8760 hours.  Radiological Exams: DG CHEST PORT 1 VIEW  Result Date: 12/04/2019 CLINICAL DATA:  Pneumonia. EXAM: PORTABLE CHEST 1 VIEW COMPARISON:  Chest x-ray 2019/12/04. FINDINGS: Tracheostomy tube in stable position. Heart size normal. Low lung volumes with bibasilar atelectasis/infiltrates. No pleural effusion or pneumothorax. Contrast noted in the colon. IMPRESSION: 1.  Tracheostomy tube noted stable position. 2.  Stable cardiomegaly. 3.  Low lung volumes with bibasilar atelectasis/infiltrates. 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 Acute on chronic respiratory failure hypoxia plan is to continue with full support on the ventilator for now.  MSSA pneumonia treated clinically is improving we'll continue with supportive care 2. Tracheostomy no changes we'll continue with supportive care 3. Delirium still confused and agitated 4. Cerebellar  infarction we'll continue with supportive care   I have personally seen and evaluated the patient, evaluated laboratory and imaging results, formulated the assessment and plan and placed orders. The Patient requires high complexity decision making with multiple systems involvement.  Rounds were done with the Respiratory Therapy Director and Staff therapists and discussed with nursing staff also.  Allyne Gee, MD [facility] Pulmonary Critical Care Medicine Sleep Medicine

## 2019-12-05 DIAGNOSIS — J9621 Acute and chronic respiratory failure with hypoxia: Secondary | ICD-10-CM | POA: Diagnosis not present

## 2019-12-05 DIAGNOSIS — I63549 Cerebral infarction due to unspecified occlusion or stenosis of unspecified cerebellar artery: Secondary | ICD-10-CM | POA: Diagnosis not present

## 2019-12-05 DIAGNOSIS — J15211 Pneumonia due to Methicillin susceptible Staphylococcus aureus: Secondary | ICD-10-CM | POA: Diagnosis not present

## 2019-12-05 DIAGNOSIS — F05 Delirium due to known physiological condition: Secondary | ICD-10-CM | POA: Diagnosis not present

## 2019-12-05 NOTE — Progress Notes (Addendum)
Pulmonary Critical Care Medicine [facility] [place]   PULMONARY CRITICAL CARE SERVICE  PROGRESS NOTE  Date of Service: 12/05/2019  Melvin Howard  [id number]:[id number]  DOB: 08-Oct-1946   DOA: 11-29-2019  Referring Physician: Carron Curie, MD  HPI: Melvin Howard is a 74 y.o. male seen for follow up of Acute on Chronic Respiratory Failure.  Patient is on full support right now on the ventilator on assist control mode was able to do about 4 hours of pressure support yesterday today the goal is to do 6 hours  Medications: Reviewed on Rounds  Physical Exam:  Vitals: Temperature 97.7 pulse 96 respiratory 19 blood pressure is 91/54 saturations 98%  Ventilator Settings on assist control FiO2 40% tidal volume is 402 PEEP 5  . General: Comfortable at this time . Eyes: Grossly normal lids, irises & conjunctiva . ENT: grossly tongue is normal . Neck: no obvious mass . Cardiovascular: S1 S2 normal no gallop . Respiratory: No rhonchi no rales are noted at this time . Abdomen: soft . Skin: no rash seen on limited exam . Musculoskeletal: not rigid . Psychiatric:unable to assess . Neurologic: no seizure no involuntary movements         Lab Data:   Basic Metabolic Panel: Recent Labs  Lab 12/01/19 0731 12/04/19 0655  NA 138 139  K 5.1 4.0  CL 102 100  CO2 25 30  GLUCOSE 160* 149*  BUN 22 20  CREATININE 0.56* 0.73  CALCIUM 8.3* 8.6*  MG 2.2 2.0  PHOS  --  3.7    ABG: No results for input(s): PHART, PCO2ART, PO2ART, HCO3, O2SAT in the last 168 hours.  Liver Function Tests: No results for input(s): AST, ALT, ALKPHOS, BILITOT, PROT, ALBUMIN in the last 168 hours. No results for input(s): LIPASE, AMYLASE in the last 168 hours. No results for input(s): AMMONIA in the last 168 hours.  CBC: Recent Labs  Lab 12/01/19 0731 12/04/19 0655  WBC 10.3 10.3  HGB 10.9* 10.2*  HCT 34.2* 32.5*  MCV 96.1 95.3  PLT 280 308    Cardiac Enzymes: No results for  input(s): CKTOTAL, CKMB, CKMBINDEX, TROPONINI in the last 168 hours.  BNP (last 3 results) No results for input(s): BNP in the last 8760 hours.  ProBNP (last 3 results) No results for input(s): PROBNP in the last 8760 hours.  Radiological Exams: DG CHEST PORT 1 VIEW  Result Date: 12/04/2019 CLINICAL DATA:  Pneumonia. EXAM: PORTABLE CHEST 1 VIEW COMPARISON:  Chest x-ray 2019/11/29. FINDINGS: Tracheostomy tube in stable position. Heart size normal. Low lung volumes with bibasilar atelectasis/infiltrates. No pleural effusion or pneumothorax. Contrast noted in the colon. IMPRESSION: 1.  Tracheostomy tube noted stable position. 2.  Stable cardiomegaly. 3.  Low lung volumes with bibasilar atelectasis/infiltrates. 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 Acute on chronic respiratory failure with hypoxia patient is going to be continued on wean protocol. 2. MSSA pneumonia treated we will continue with supportive care follow-up chest x-ray 3. Tracheostomy continue present management 4. Delirium no change 5. Cerebellar infarction patient is at baseline   I have personally seen and evaluated the patient, evaluated laboratory and imaging results, formulated the  assessment and plan and placed orders. The Patient requires high complexity decision making with multiple systems involvement.  Rounds were done with the Respiratory Therapy Director and Staff therapists and discussed with nursing staff also.  Allyne Gee, MD [facility] Pulmonary Critical Care Medicine Sleep Medicine

## 2019-12-06 DIAGNOSIS — I63549 Cerebral infarction due to unspecified occlusion or stenosis of unspecified cerebellar artery: Secondary | ICD-10-CM | POA: Diagnosis not present

## 2019-12-06 DIAGNOSIS — J15211 Pneumonia due to Methicillin susceptible Staphylococcus aureus: Secondary | ICD-10-CM | POA: Diagnosis not present

## 2019-12-06 DIAGNOSIS — J9621 Acute and chronic respiratory failure with hypoxia: Secondary | ICD-10-CM | POA: Diagnosis not present

## 2019-12-06 DIAGNOSIS — F05 Delirium due to known physiological condition: Secondary | ICD-10-CM | POA: Diagnosis not present

## 2019-12-06 LAB — CBC
HCT: 32.1 % — ABNORMAL LOW (ref 39.0–52.0)
Hemoglobin: 9.9 g/dL — ABNORMAL LOW (ref 13.0–17.0)
MCH: 29.7 pg (ref 26.0–34.0)
MCHC: 30.8 g/dL (ref 30.0–36.0)
MCV: 96.4 fL (ref 80.0–100.0)
Platelets: 352 10*3/uL (ref 150–400)
RBC: 3.33 MIL/uL — ABNORMAL LOW (ref 4.22–5.81)
RDW: 15.9 % — ABNORMAL HIGH (ref 11.5–15.5)
WBC: 8.2 10*3/uL (ref 4.0–10.5)
nRBC: 0.2 % (ref 0.0–0.2)

## 2019-12-06 LAB — BASIC METABOLIC PANEL WITH GFR
Anion gap: 10 (ref 5–15)
BUN: 21 mg/dL (ref 8–23)
CO2: 29 mmol/L (ref 22–32)
Calcium: 8.4 mg/dL — ABNORMAL LOW (ref 8.9–10.3)
Chloride: 99 mmol/L (ref 98–111)
Creatinine, Ser: 0.71 mg/dL (ref 0.61–1.24)
GFR calc Af Amer: >60 mL/min
GFR calc non Af Amer: >60 mL/min
Glucose, Bld: 129 mg/dL — ABNORMAL HIGH (ref 70–99)
Potassium: 4.2 mmol/L (ref 3.5–5.1)
Sodium: 138 mmol/L (ref 135–145)

## 2019-12-06 LAB — MAGNESIUM: Magnesium: 2.1 mg/dL (ref 1.7–2.4)

## 2019-12-06 LAB — PHOSPHORUS: Phosphorus: 3.9 mg/dL (ref 2.5–4.6)

## 2019-12-06 NOTE — Progress Notes (Addendum)
Pulmonary Critical Care Medicine [facility] [place]   PULMONARY CRITICAL CARE SERVICE  PROGRESS NOTE  Date of Service: 12/06/2019  Melvin Howard  OAC:[id number]  DOB: [birth year]   DOA: 10/28/2019  Referring Physician: Carron Curie, MD  HPI: Melvin Howard is a 74 y.o. male seen for follow up of Acute on Chronic Respiratory Failure.  Patient mains on full support on the ventilator at this time unable to wean.  Currently on 35% FiO2.  Medications: Reviewed on Rounds  Physical Exam:  Vitals: Pulse 63 respirations 18 BP 97/58 O2 sat 99% temp 97.0  Ventilator Settings ventilator mode AC PC rate 15 tidal 11/[birth year]/1948 PEEP 5 and FiO2 35%  . General: Comfortable at this time . Eyes: Grossly normal lids, irises & conjunctiva . ENT: grossly tongue is normal . Neck: no obvious mass . Cardiovascular: S1 S2 normal no gallop . Respiratory: No rales or rhonchi noted . Abdomen: soft . Skin: no rash seen on limited exam . Musculoskeletal: not rigid . Psychiatric:unable to assess . Neurologic: no seizure no involuntary movements         Lab Data:   Basic Metabolic Panel: Recent Labs  Lab 12/01/19 0731 12/04/19 0655 12/06/19 0625  NA 138 139 138  K 5.1 4.0 4.2  CL 102 100 99  CO2 25 30 29   GLUCOSE 160* 149* 129*  BUN 22 20 21   CREATININE 0.56* 0.73 0.71  CALCIUM 8.3* 8.6* 8.4*  MG 2.2 2.0 2.1  PHOS  --  3.7 3.9    ABG: No results for input(s): PHART, PCO2ART, PO2ART, HCO3, O2SAT in the last 168 hours.  Liver Function Tests: No results for input(s): AST, ALT, ALKPHOS, BILITOT, PROT, ALBUMIN in the last 168 hours. No results for input(s): LIPASE, AMYLASE in the last 168 hours. No results for input(s): AMMONIA in the last 168 hours.  CBC: Recent Labs  Lab 12/01/19 0731 12/04/19 0655 12/06/19 0625  WBC 10.3 10.3 8.2  HGB 10.9* 10.2* 9.9*  HCT 34.2* 32.5* 32.1*  MCV 96.1 95.3 96.4  PLT 280 308 352    Cardiac Enzymes: No results for input(s):  CKTOTAL, CKMB, CKMBINDEX, TROPONINI in the last 168 hours.  BNP (last 3 results) No results for input(s): BNP in the last 8760 hours.  ProBNP (last 3 results) No results for input(s): PROBNP in the last 8760 hours.  Radiological Exams: No results found.  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 Acute on chronic respiratory failure with hypoxia patient mains on full support unable to wean at this time.  Continue aggressive pulmonary toilet and attempt weaning per protocol. 2. MSSA pneumonia treated we will continue with supportive care follow-up chest x-ray 3. Tracheostomy continue present management 4. Delirium no change 5. Cerebellar infarction patient is at baseline   I have personally seen and evaluated the patient, evaluated laboratory and imaging results, formulated the assessment and plan and placed orders. The Patient requires high complexity decision making with multiple systems involvement.  Rounds were done with the Respiratory Therapy Director and Staff therapists and discussed with nursing staff also.  02/01/20, MD [facility] Pulmonary Critical Care Medicine Sleep Medicine

## 2019-12-07 DIAGNOSIS — F05 Delirium due to known physiological condition: Secondary | ICD-10-CM | POA: Diagnosis not present

## 2019-12-07 DIAGNOSIS — J9621 Acute and chronic respiratory failure with hypoxia: Secondary | ICD-10-CM | POA: Diagnosis not present

## 2019-12-07 DIAGNOSIS — J15211 Pneumonia due to Methicillin susceptible Staphylococcus aureus: Secondary | ICD-10-CM | POA: Diagnosis not present

## 2019-12-07 DIAGNOSIS — I63549 Cerebral infarction due to unspecified occlusion or stenosis of unspecified cerebellar artery: Secondary | ICD-10-CM | POA: Diagnosis not present

## 2019-12-07 NOTE — Progress Notes (Signed)
Pulmonary Critical Care Medicine [facility] [place]   PULMONARY CRITICAL CARE SERVICE  PROGRESS NOTE  Date of Service: 12/07/2019  Melvin Howard  KWI:[id number]  DOB: [birth year]   DOA: 11/04/2019  Referring Physician: Carron Curie, MD  HPI: Melvin Howard is a 74 y.o. male seen for follow up of Acute on Chronic Respiratory Failure.  Patient is on full support on the ventilator has not been tolerating weaning right now is on assist control mode requiring 35% FiO2  Medications: Reviewed on Rounds  Physical Exam:  Vitals: Temperature 98.3 pulse 74 respiratory rate 17 blood pressure is 119/64 saturations 99%  Ventilator Settings mode ventilation assist control FiO2 35% tidal volume 501 PEEP 5  . General: Comfortable at this time . Eyes: Grossly normal lids, irises & conjunctiva . ENT: grossly tongue is normal . Neck: no obvious mass . Cardiovascular: S1 S2 normal no gallop . Respiratory: No rhonchi coarse breath sounds are noted . Abdomen: soft . Skin: no rash seen on limited exam . Musculoskeletal: not rigid . Psychiatric:unable to assess . Neurologic: no seizure no involuntary movements         Lab Data:   Basic Metabolic Panel: Recent Labs  Lab 12/01/19 0731 12/04/19 0655 12/06/19 0625  NA 138 139 138  K 5.1 4.0 4.2  CL 102 100 99  CO2 25 30 29   GLUCOSE 160* 149* 129*  BUN 22 20 21   CREATININE 0.56* 0.73 0.71  CALCIUM 8.3* 8.6* 8.4*  MG 2.2 2.0 2.1  PHOS  --  3.7 3.9    ABG: No results for input(s): PHART, PCO2ART, PO2ART, HCO3, O2SAT in the last 168 hours.  Liver Function Tests: No results for input(s): AST, ALT, ALKPHOS, BILITOT, PROT, ALBUMIN in the last 168 hours. No results for input(s): LIPASE, AMYLASE in the last 168 hours. No results for input(s): AMMONIA in the last 168 hours.  CBC: Recent Labs  Lab 12/01/19 0731 12/04/19 0655 12/06/19 0625  WBC 10.3 10.3 8.2  HGB 10.9* 10.2* 9.9*  HCT 34.2* 32.5* 32.1*  MCV 96.1  95.3 96.4  PLT 280 308 352    Cardiac Enzymes: No results for input(s): CKTOTAL, CKMB, CKMBINDEX, TROPONINI in the last 168 hours.  BNP (last 3 results) No results for input(s): BNP in the last 8760 hours.  ProBNP (last 3 results) No results for input(s): PROBNP in the last 8760 hours.  Radiological Exams: No results found.  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 Acute on chronic respiratory failure hypoxia plan is to continue with full support on the ventilator.  Continue secretion management pulmonary toilet. 2. MSSA bacteremia treated we will continue to follow 3. Tracheostomy remains in place 4. Delirium no changes we will continue with present management 5. Cerebellar infarction at baseline we will continue with supportive care   I have personally seen and evaluated the patient, evaluated laboratory and imaging results, formulated the assessment and plan and placed orders. The Patient requires high complexity decision making with multiple systems involvement.  Rounds were done with the Respiratory Therapy Director and Staff therapists and discussed with nursing staff also.  02/01/20, MD [facility] Pulmonary Critical Care Medicine Sleep Medicine

## 2019-12-08 DIAGNOSIS — F05 Delirium due to known physiological condition: Secondary | ICD-10-CM | POA: Diagnosis not present

## 2019-12-08 DIAGNOSIS — J9621 Acute and chronic respiratory failure with hypoxia: Secondary | ICD-10-CM | POA: Diagnosis not present

## 2019-12-08 DIAGNOSIS — I63549 Cerebral infarction due to unspecified occlusion or stenosis of unspecified cerebellar artery: Secondary | ICD-10-CM | POA: Diagnosis not present

## 2019-12-08 DIAGNOSIS — J15211 Pneumonia due to Methicillin susceptible Staphylococcus aureus: Secondary | ICD-10-CM | POA: Diagnosis not present

## 2019-12-08 LAB — BASIC METABOLIC PANEL
Anion gap: 10 (ref 5–15)
BUN: 12 mg/dL (ref 8–23)
CO2: 28 mmol/L (ref 22–32)
Calcium: 8.6 mg/dL — ABNORMAL LOW (ref 8.9–10.3)
Chloride: 98 mmol/L (ref 98–111)
Creatinine, Ser: 0.65 mg/dL (ref 0.61–1.24)
GFR calc Af Amer: >60 mL/min (ref >60)
GFR calc non Af Amer: >60 mL/min (ref >60)
Glucose, Bld: 164 mg/dL — ABNORMAL HIGH (ref 70–99)
Potassium: 4 mmol/L (ref 3.5–5.1)
Sodium: 136 mmol/L (ref 135–145)

## 2019-12-08 LAB — CBC
HCT: 32.5 % — ABNORMAL LOW (ref 39.0–52.0)
Hemoglobin: 10.3 g/dL — ABNORMAL LOW (ref 13.0–17.0)
MCH: 30 pg (ref 26.0–34.0)
MCHC: 31.7 g/dL (ref 30.0–36.0)
MCV: 94.8 fL (ref 80.0–100.0)
Platelets: 438 10*3/uL — ABNORMAL HIGH (ref 150–400)
RBC: 3.43 MIL/uL — ABNORMAL LOW (ref 4.22–5.81)
RDW: 15.7 % — ABNORMAL HIGH (ref 11.5–15.5)
WBC: 11.9 10*3/uL — ABNORMAL HIGH (ref 4.0–10.5)
nRBC: 0 % (ref 0.0–0.2)

## 2019-12-08 LAB — MAGNESIUM: Magnesium: 1.9 mg/dL (ref 1.7–2.4)

## 2019-12-08 NOTE — Progress Notes (Signed)
Pulmonary Critical Care Medicine [facility] [place]   PULMONARY CRITICAL CARE SERVICE  PROGRESS NOTE  Date of Service: 12/08/2019  Melvin Howard  BJY:[id number]  DOB: 07-24-1946   DOA: 12/02/2019  Referring Physician: Carron Curie, MD  HPI: Melvin Howard is a 74 y.o. male seen for follow up of Acute on Chronic Respiratory Failure.  Patient currently is on full support on assist control mode has been on 28% FiO2  Medications: Reviewed on Rounds  Physical Exam:  Vitals: Temperature 98.9 pulse 88 respiratory rate 27 blood pressure is 121/81 saturations 96%  Ventilator Settings mode ventilation assist control FiO2 20% tidal line 473 PEEP 5  . General: Comfortable at this time . Eyes: Grossly normal lids, irises & conjunctiva . ENT: grossly tongue is normal . Neck: no obvious mass . Cardiovascular: S1 S2 normal no gallop . Respiratory: No rhonchi no rales are noted at this time . Abdomen: soft . Skin: no rash seen on limited exam . Musculoskeletal: not rigid . Psychiatric:unable to assess . Neurologic: no seizure no involuntary movements         Lab Data:   Basic Metabolic Panel: Recent Labs  Lab 12/04/19 0655 12/06/19 0625 12/08/19 0548  NA 139 138 136  K 4.0 4.2 4.0  CL 100 99 98  CO2 30 29 28   GLUCOSE 149* 129* 164*  BUN 20 21 12   CREATININE 0.73 0.71 0.65  CALCIUM 8.6* 8.4* 8.6*  MG 2.0 2.1 1.9  PHOS 3.7 3.9  --     ABG: No results for input(s): PHART, PCO2ART, PO2ART, HCO3, O2SAT in the last 168 hours.  Liver Function Tests: No results for input(s): AST, ALT, ALKPHOS, BILITOT, PROT, ALBUMIN in the last 168 hours. No results for input(s): LIPASE, AMYLASE in the last 168 hours. No results for input(s): AMMONIA in the last 168 hours.  CBC: Recent Labs  Lab 12/04/19 0655 12/06/19 0625 12/08/19 0548  WBC 10.3 8.2 11.9*  HGB 10.2* 9.9* 10.3*  HCT 32.5* 32.1* 32.5*  MCV 95.3 96.4 94.8  PLT 308 352 438*    Cardiac  Enzymes: No results for input(s): CKTOTAL, CKMB, CKMBINDEX, TROPONINI in the last 168 hours.  BNP (last 3 results) No results for input(s): BNP in the last 8760 hours.  ProBNP (last 3 results) No results for input(s): PROBNP in the last 8760 hours.  Radiological Exams: No results found.  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 Acute on chronic respiratory failure with hypoxia patient was attempted on spontaneous breathing trial but did not tolerate secondary to increased secretions continue with pulmonary toilet supportive care reassess in the morning 2. MSSA pneumonia treated this is slow to improve we will continue pulmonary toilet 3. Delirium no change we will continue to follow 4. Tracheostomy at baseline continue present management 5. Cerebellar infarction patient has had no improvement neurologically   I have personally seen and evaluated the patient, evaluated laboratory and imaging results, formulated the assessment and plan and placed orders. The Patient requires high complexity decision making with multiple systems involvement.  Rounds were done with the Respiratory Therapy Director and Staff therapists and discussed with nursing staff also.  02/03/20, MD [facility] Pulmonary Critical Care Medicine Sleep Medicine

## 2019-12-09 DIAGNOSIS — F05 Delirium due to known physiological condition: Secondary | ICD-10-CM | POA: Diagnosis not present

## 2019-12-09 DIAGNOSIS — J9621 Acute and chronic respiratory failure with hypoxia: Secondary | ICD-10-CM | POA: Diagnosis not present

## 2019-12-09 DIAGNOSIS — J15211 Pneumonia due to Methicillin susceptible Staphylococcus aureus: Secondary | ICD-10-CM | POA: Diagnosis not present

## 2019-12-09 DIAGNOSIS — I63549 Cerebral infarction due to unspecified occlusion or stenosis of unspecified cerebellar artery: Secondary | ICD-10-CM | POA: Diagnosis not present

## 2019-12-09 NOTE — Progress Notes (Signed)
Pulmonary Critical Care Medicine [facility] [place]   PULMONARY CRITICAL CARE SERVICE  PROGRESS NOTE  Date of Service: 12/09/2019  Melvin Howard  VFI:[id number]  DOB: 09-23-1946   DOA: 11/26/2019  Referring Physician: Carron Curie, MD  HPI: Melvin Howard is a 74 y.o. male seen for follow up of Acute on Chronic Respiratory Failure.  Patient is on full support agitated all over the bed respiratory therapy has tried to wean him however he has not been tolerating any weaning whatsoever.  Patient has not been able to cooperate with the therapist when they are weaning him.  Medications: Reviewed on Rounds  Physical Exam:  Vitals: Temperature 99.2 pulse 76 respiratory rate 21 blood pressure 116/75 saturations 97%  Ventilator Settings mode ventilation assist control FiO2 35% tidal volume 432 PEEP 5  . General: Comfortable at this time . Eyes: Grossly normal lids, irises & conjunctiva . ENT: grossly tongue is normal . Neck: no obvious mass . Cardiovascular: S1 S2 normal no gallop . Respiratory: No rhonchi no rales are noted at this time . Abdomen: soft . Skin: no rash seen on limited exam . Musculoskeletal: not rigid . Psychiatric:unable to assess . Neurologic: no seizure no involuntary movements         Lab Data:   Basic Metabolic Panel: Recent Labs  Lab 12/04/19 0655 12/06/19 0625 12/08/19 0548  NA 139 138 136  K 4.0 4.2 4.0  CL 100 99 98  CO2 30 29 28   GLUCOSE 149* 129* 164*  BUN 20 21 12   CREATININE 0.73 0.71 0.65  CALCIUM 8.6* 8.4* 8.6*  MG 2.0 2.1 1.9  PHOS 3.7 3.9  --     ABG: No results for input(s): PHART, PCO2ART, PO2ART, HCO3, O2SAT in the last 168 hours.  Liver Function Tests: No results for input(s): AST, ALT, ALKPHOS, BILITOT, PROT, ALBUMIN in the last 168 hours. No results for input(s): LIPASE, AMYLASE in the last 168 hours. No results for input(s): AMMONIA in the last 168 hours.  CBC: Recent Labs  Lab 12/04/19 0655  12/06/19 0625 12/08/19 0548  WBC 10.3 8.2 11.9*  HGB 10.2* 9.9* 10.3*  HCT 32.5* 32.1* 32.5*  MCV 95.3 96.4 94.8  PLT 308 352 438*    Cardiac Enzymes: No results for input(s): CKTOTAL, CKMB, CKMBINDEX, TROPONINI in the last 168 hours.  BNP (last 3 results) No results for input(s): BNP in the last 8760 hours.  ProBNP (last 3 results) No results for input(s): PROBNP in the last 8760 hours.  Radiological Exams: No results found.  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 Acute on chronic respiratory failure with hypoxia plan is to continue with the vent on full support on assist control mode. 2. MSSA pneumonia treated follow-up radiologically 3. Delirium no change medications are being adjusted. 4. Tracheostomy remains in place 5. Cerebellar infarction no change we will continue with present management   I have personally seen and evaluated the patient, evaluated laboratory and imaging results, formulated the assessment and plan and placed orders. The Patient requires high complexity decision making with multiple systems involvement.  Rounds were done with the Respiratory Therapy Director and Staff therapists and discussed with nursing staff also.  02/03/20, MD [facility] Pulmonary Critical Care Medicine Sleep  Medicine

## 2019-12-10 DIAGNOSIS — I63549 Cerebral infarction due to unspecified occlusion or stenosis of unspecified cerebellar artery: Secondary | ICD-10-CM | POA: Diagnosis not present

## 2019-12-10 DIAGNOSIS — J9621 Acute and chronic respiratory failure with hypoxia: Secondary | ICD-10-CM | POA: Diagnosis not present

## 2019-12-10 DIAGNOSIS — J15211 Pneumonia due to Methicillin susceptible Staphylococcus aureus: Secondary | ICD-10-CM | POA: Diagnosis not present

## 2019-12-10 DIAGNOSIS — F05 Delirium due to known physiological condition: Secondary | ICD-10-CM | POA: Diagnosis not present

## 2019-12-10 NOTE — Progress Notes (Signed)
Pulmonary Critical Care Medicine [facility] [place]   PULMONARY CRITICAL CARE SERVICE  PROGRESS NOTE  Date of Service: 12/10/2019  Melvin Howard  TGG:[id number]  DOB: May 22, 1946   DOA: 11/14/2019  Referring Physician: Carron Curie, MD  HPI: Melvin Howard is a 74 y.o. male seen for follow up of Acute on Chronic Respiratory Failure.  Patient right now is on the ventilator and full support was tried on spontaneous breathing trial however patient did not tolerate so was placed back on the vent  Medications: Reviewed on Rounds  Physical Exam:  Vitals: Temperature 98.0 pulse 71 respiratory rate 21 blood pressure is 118/71 saturations 98%  Ventilator Settings mode of ventilation assist control FiO2 28% tidal volume 474 PEEP 5  . General: Comfortable at this time . Eyes: Grossly normal lids, irises & conjunctiva . ENT: grossly tongue is normal . Neck: no obvious mass . Cardiovascular: S1 S2 normal no gallop . Respiratory: No rhonchi no rales are noted at this time . Abdomen: soft . Skin: no rash seen on limited exam . Musculoskeletal: not rigid . Psychiatric:unable to assess . Neurologic: no seizure no involuntary movements         Lab Data:   Basic Metabolic Panel: Recent Labs  Lab 12/04/19 0655 12/06/19 0625 12/08/19 0548  NA 139 138 136  K 4.0 4.2 4.0  CL 100 99 98  CO2 30 29 28   GLUCOSE 149* 129* 164*  BUN 20 21 12   CREATININE 0.73 0.71 0.65  CALCIUM 8.6* 8.4* 8.6*  MG 2.0 2.1 1.9  PHOS 3.7 3.9  --     ABG: No results for input(s): PHART, PCO2ART, PO2ART, HCO3, O2SAT in the last 168 hours.  Liver Function Tests: No results for input(s): AST, ALT, ALKPHOS, BILITOT, PROT, ALBUMIN in the last 168 hours. No results for input(s): LIPASE, AMYLASE in the last 168 hours. No results for input(s): AMMONIA in the last 168 hours.  CBC: Recent Labs  Lab 12/04/19 0655 12/06/19 0625 12/08/19 0548  WBC 10.3 8.2 11.9*  HGB 10.2* 9.9* 10.3*   HCT 32.5* 32.1* 32.5*  MCV 95.3 96.4 94.8  PLT 308 352 438*    Cardiac Enzymes: No results for input(s): CKTOTAL, CKMB, CKMBINDEX, TROPONINI in the last 168 hours.  BNP (last 3 results) No results for input(s): BNP in the last 8760 hours.  ProBNP (last 3 results) No results for input(s): PROBNP in the last 8760 hours.  Radiological Exams: No results found.  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 Acute on chronic respiratory failure with hypoxia plan is to continue with full support on the ventilator since the patient failed a spontaneous breathing trial.  Will reassess again tomorrow 2. MSSA pneumonia treated clinically resolved. 3. Tracheostomy remains in place 4. Delirium no change we will continue to follow 5. Cerebellar infarction treated continue with supportive care   I have personally seen and evaluated the patient, evaluated laboratory and imaging results, formulated the assessment and plan and placed orders. The Patient requires high complexity decision making with multiple systems involvement.  Rounds were done with the Respiratory Therapy Director and Staff therapists and discussed with nursing staff also.  02/03/20, MD [facility] Pulmonary Critical Care Medicine Sleep Medicine

## 2019-12-11 DIAGNOSIS — I63549 Cerebral infarction due to unspecified occlusion or stenosis of unspecified cerebellar artery: Secondary | ICD-10-CM | POA: Diagnosis not present

## 2019-12-11 DIAGNOSIS — F05 Delirium due to known physiological condition: Secondary | ICD-10-CM | POA: Diagnosis not present

## 2019-12-11 DIAGNOSIS — J15211 Pneumonia due to Methicillin susceptible Staphylococcus aureus: Secondary | ICD-10-CM | POA: Diagnosis not present

## 2019-12-11 DIAGNOSIS — J9621 Acute and chronic respiratory failure with hypoxia: Secondary | ICD-10-CM | POA: Diagnosis not present

## 2019-12-11 LAB — BASIC METABOLIC PANEL
Anion gap: 9 (ref 5–15)
BUN: 14 mg/dL (ref 8–23)
CO2: 29 mmol/L (ref 22–32)
Calcium: 8.5 mg/dL — ABNORMAL LOW (ref 8.9–10.3)
Chloride: 100 mmol/L (ref 98–111)
Creatinine, Ser: 0.58 mg/dL — ABNORMAL LOW (ref 0.61–1.24)
GFR calc Af Amer: >60 mL/min (ref >60)
GFR calc non Af Amer: >60 mL/min (ref >60)
Glucose, Bld: 147 mg/dL — ABNORMAL HIGH (ref 70–99)
Potassium: 4.4 mmol/L (ref 3.5–5.1)
Sodium: 138 mmol/L (ref 135–145)

## 2019-12-11 LAB — CBC
HCT: 31.8 % — ABNORMAL LOW (ref 39.0–52.0)
Hemoglobin: 10.1 g/dL — ABNORMAL LOW (ref 13.0–17.0)
MCH: 30.1 pg (ref 26.0–34.0)
MCHC: 31.8 g/dL (ref 30.0–36.0)
MCV: 94.9 fL (ref 80.0–100.0)
Platelets: 412 10*3/uL — ABNORMAL HIGH (ref 150–400)
RBC: 3.35 MIL/uL — ABNORMAL LOW (ref 4.22–5.81)
RDW: 15.6 % — ABNORMAL HIGH (ref 11.5–15.5)
WBC: 11.1 10*3/uL — ABNORMAL HIGH (ref 4.0–10.5)
nRBC: 0 % (ref 0.0–0.2)

## 2019-12-11 LAB — MAGNESIUM: Magnesium: 2.1 mg/dL (ref 1.7–2.4)

## 2019-12-11 NOTE — Progress Notes (Signed)
Pulmonary Critical Care Medicine [facility] [place]   PULMONARY CRITICAL CARE SERVICE  PROGRESS NOTE  Date of Service: 12/11/2019  Melvin Howard  TIR:[id number]  DOB: 1946-07-16   DOA: December 07, 2019  Referring Physician: Carron Curie, MD  HPI: Melvin Howard is a 74 y.o. male seen for follow up of Acute on Chronic Respiratory Failure.  Patient currently is on assist control mode has been on 40% FiO2 doing fairly well however has not been tolerating spontaneous breathing trials.  Still agitated and anxious  Medications: Reviewed on Rounds  Physical Exam:  Vitals: Temperature 96.9 pulse 64 respiratory 21 blood pressure is 118/67 saturations 100%  Ventilator Settings mode ventilation assist control FiO2 40% tidal volume 442 PEEP 5  . General: Comfortable at this time . Eyes: Grossly normal lids, irises & conjunctiva . ENT: grossly tongue is normal . Neck: no obvious mass . Cardiovascular: S1 S2 normal no gallop . Respiratory: No rhonchi no rales are noted at this time . Abdomen: soft . Skin: no rash seen on limited exam . Musculoskeletal: not rigid . Psychiatric:unable to assess . Neurologic: no seizure no involuntary movements         Lab Data:   Basic Metabolic Panel: Recent Labs  Lab 12/06/19 0625 12/08/19 0548 12/11/19 0742  NA 138 136 138  K 4.2 4.0 4.4  CL 99 98 100  CO2 29 28 29   GLUCOSE 129* 164* 147*  BUN 21 12 14   CREATININE 0.71 0.65 0.58*  CALCIUM 8.4* 8.6* 8.5*  MG 2.1 1.9 2.1  PHOS 3.9  --   --     ABG: No results for input(s): PHART, PCO2ART, PO2ART, HCO3, O2SAT in the last 168 hours.  Liver Function Tests: No results for input(s): AST, ALT, ALKPHOS, BILITOT, PROT, ALBUMIN in the last 168 hours. No results for input(s): LIPASE, AMYLASE in the last 168 hours. No results for input(s): AMMONIA in the last 168 hours.  CBC: Recent Labs  Lab 12/06/19 0625 12/08/19 0548 12/11/19 0742  WBC 8.2 11.9* 11.1*  HGB 9.9* 10.3*  10.1*  HCT 32.1* 32.5* 31.8*  MCV 96.4 94.8 94.9  PLT 352 438* 412*    Cardiac Enzymes: No results for input(s): CKTOTAL, CKMB, CKMBINDEX, TROPONINI in the last 168 hours.  BNP (last 3 results) No results for input(s): BNP in the last 8760 hours.  ProBNP (last 3 results) No results for input(s): PROBNP in the last 8760 hours.  Radiological Exams: No results found.  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 Acute on chronic respiratory failure hypoxia patient remains on full support on assist control mode has been on 40% FiO2 with a tidal volume 442 and a PEEP of 5 2. MSSA pneumonia treated clinically is improving 3. Tracheostomy remains in place 4. Delirium no major changes or improvement noted 5. Cerebellar infarction we will continue with supportive care   I have personally seen and evaluated the patient, evaluated laboratory and imaging results, formulated the assessment and plan and placed orders. The Patient requires high complexity decision making with multiple systems involvement.  Rounds were done with the Respiratory Therapy Director and Staff therapists and discussed with nursing staff also.  02/05/20, MD [facility] Pulmonary Critical Care Medicine Sleep Medicine

## 2019-12-12 DIAGNOSIS — J15211 Pneumonia due to Methicillin susceptible Staphylococcus aureus: Secondary | ICD-10-CM | POA: Diagnosis not present

## 2019-12-12 DIAGNOSIS — J9621 Acute and chronic respiratory failure with hypoxia: Secondary | ICD-10-CM | POA: Diagnosis not present

## 2019-12-12 DIAGNOSIS — I63549 Cerebral infarction due to unspecified occlusion or stenosis of unspecified cerebellar artery: Secondary | ICD-10-CM | POA: Diagnosis not present

## 2019-12-12 DIAGNOSIS — F05 Delirium due to known physiological condition: Secondary | ICD-10-CM | POA: Diagnosis not present

## 2019-12-12 NOTE — Progress Notes (Signed)
Pulmonary Critical Care Medicine [facility] [place]   PULMONARY CRITICAL CARE SERVICE  PROGRESS NOTE  Date of Service: 12/12/2019  Melvin Howard  ALP:[id number]  DOB: [birth year]   DOA: 10/29/2019  Referring Physician: Carron Curie, MD  HPI: Melvin Howard is a 74 y.o. male seen for follow up of Acute on Chronic Respiratory Failure.  Patient currently is on full support on assist control mode has been on 28% FiO2 with good saturations  Medications: Reviewed on Rounds  Physical Exam:  Vitals: Temperature 98.9 pulse 94 respiratory 22 blood pressure is 101/70 saturations 99%  Ventilator Settings on assist control FiO2 20% tidal volume 450 PEEP 5  . General: Comfortable at this time . Eyes: Grossly normal lids, irises & conjunctiva . ENT: grossly tongue is normal . Neck: no obvious mass . Cardiovascular: S1 S2 normal no gallop . Respiratory: No rhonchi no rales . Abdomen: soft . Skin: no rash seen on limited exam . Musculoskeletal: not rigid . Psychiatric:unable to assess . Neurologic: no seizure no involuntary movements         Lab Data:   Basic Metabolic Panel: Recent Labs  Lab 12/06/19 0625 12/08/19 0548 12/11/19 0742  NA 138 136 138  K 4.2 4.0 4.4  CL 99 98 100  CO2 29 28 29   GLUCOSE 129* 164* 147*  BUN 21 12 14   CREATININE 0.71 0.65 0.58*  CALCIUM 8.4* 8.6* 8.5*  MG 2.1 1.9 2.1  PHOS 3.9  --   --     ABG: No results for input(s): PHART, PCO2ART, PO2ART, HCO3, O2SAT in the last 168 hours.  Liver Function Tests: No results for input(s): AST, ALT, ALKPHOS, BILITOT, PROT, ALBUMIN in the last 168 hours. No results for input(s): LIPASE, AMYLASE in the last 168 hours. No results for input(s): AMMONIA in the last 168 hours.  CBC: Recent Labs  Lab 12/06/19 0625 12/08/19 0548 12/11/19 0742  WBC 8.2 11.9* 11.1*  HGB 9.9* 10.3* 10.1*  HCT 32.1* 32.5* 31.8*  MCV 96.4 94.8 94.9  PLT 352 438* 412*    Cardiac Enzymes: No results for  input(s): CKTOTAL, CKMB, CKMBINDEX, TROPONINI in the last 168 hours.  BNP (last 3 results) No results for input(s): BNP in the last 8760 hours.  ProBNP (last 3 results) No results for input(s): PROBNP in the last 8760 hours.  Radiological Exams: No results found.  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 Acute on chronic respiratory failure hypoxia patient continues to fail spontaneous breathing trials remains on the ventilator right now is on assist control mode on 28% FiO2 2. MSSA pneumonia treated we will continue present management 3. Delirium no change 4. Cerebellar infarction at baseline 5. Tracheostomy remains in place   I have personally seen and evaluated the patient, evaluated laboratory and imaging results, formulated the assessment and plan and placed orders. The Patient requires high complexity decision making with multiple systems involvement.  Rounds were done with the Respiratory Therapy Director and Staff therapists and discussed with nursing staff also.  02/05/20, MD [facility] Pulmonary Critical Care Medicine Sleep Medicine

## 2019-12-13 DIAGNOSIS — F05 Delirium due to known physiological condition: Secondary | ICD-10-CM | POA: Diagnosis not present

## 2019-12-13 DIAGNOSIS — J15211 Pneumonia due to Methicillin susceptible Staphylococcus aureus: Secondary | ICD-10-CM | POA: Diagnosis not present

## 2019-12-13 DIAGNOSIS — I63549 Cerebral infarction due to unspecified occlusion or stenosis of unspecified cerebellar artery: Secondary | ICD-10-CM | POA: Diagnosis not present

## 2019-12-13 DIAGNOSIS — J9621 Acute and chronic respiratory failure with hypoxia: Secondary | ICD-10-CM | POA: Diagnosis not present

## 2019-12-13 LAB — BASIC METABOLIC PANEL
Anion gap: 9 (ref 5–15)
BUN: 19 mg/dL (ref 8–23)
CO2: 32 mmol/L (ref 22–32)
Calcium: 8.5 mg/dL — ABNORMAL LOW (ref 8.9–10.3)
Chloride: 99 mmol/L (ref 98–111)
Creatinine, Ser: 0.65 mg/dL (ref 0.61–1.24)
GFR calc Af Amer: >60 mL/min (ref >60)
GFR calc non Af Amer: >60 mL/min (ref >60)
Glucose, Bld: 111 mg/dL — ABNORMAL HIGH (ref 70–99)
Potassium: 3.6 mmol/L (ref 3.5–5.1)
Sodium: 140 mmol/L (ref 135–145)

## 2019-12-13 NOTE — Progress Notes (Addendum)
Pulmonary Critical Care Medicine [facility] [place]   PULMONARY CRITICAL CARE SERVICE  PROGRESS NOTE  Date of Service: 12/13/2019  Melvin Howard  [id number]  DOB: 01/30/1946   DOA: 12/14/2019  Referring Physician: Carron Curie, MD  HPI: Melvin Howard is a 74 y.o. male seen for follow up of Acute on Chronic Respiratory Failure.  Patient has failed to wean twice today.  Was able to wean for 6 hours yesterday has been unsuccessful today.  Remains on full poor.  Medications: Reviewed on Rounds  Physical Exam:  Vitals: Pulse 71 respirations 18 BP 101/59 O2 sat 97% temp 97.4  Ventilator Settings ventilator mode AC VC rate of 15 tidal volume 450 PEEP of 5 FiO2 28%  . General: Comfortable at this time . Eyes: Grossly normal lids, irises & conjunctiva . ENT: grossly tongue is normal . Neck: no obvious mass . Cardiovascular: S1 S2 normal no gallop . Respiratory: No rales or rhonchi noted . Abdomen: soft . Skin: no rash seen on limited exam . Musculoskeletal: not rigid . Psychiatric:unable to assess . Neurologic: no seizure no involuntary movements         Lab Data:   Basic Metabolic Panel: Recent Labs  Lab 12/08/19 0548 12/11/19 0742 12/13/19 0635  NA 136 138 140  K 4.0 4.4 3.6  CL 98 100 99  CO2 28 29 32  GLUCOSE 164* 147* 111*  BUN 12 14 19   CREATININE 0.65 0.58* 0.65  CALCIUM 8.6* 8.5* 8.5*  MG 1.9 2.1  --     ABG: No results for input(s): PHART, PCO2ART, PO2ART, HCO3, O2SAT in the last 168 hours.  Liver Function Tests: No results for input(s): AST, ALT, ALKPHOS, BILITOT, PROT, ALBUMIN in the last 168 hours. No results for input(s): LIPASE, AMYLASE in the last 168 hours. No results for input(s): AMMONIA in the last 168 hours.  CBC: Recent Labs  Lab 12/08/19 0548 12/11/19 0742  WBC 11.9* 11.1*  HGB 10.3* 10.1*  HCT 32.5* 31.8*  MCV 94.8 94.9  PLT 438* 412*    Cardiac Enzymes: No results for input(s): CKTOTAL, CKMB,  CKMBINDEX, TROPONINI in the last 168 hours.  BNP (last 3 results) No results for input(s): BNP in the last 8760 hours.  ProBNP (last 3 results) No results for input(s): PROBNP in the last 8760 hours.  Radiological Exams: No results found.  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 Acute on chronic respiratory failure hypoxia we will continue to wean per protocol.  Patient remains on full support at this time to wean twice today.  Continue aggressive pulmonary toilet and supportive measures. 2. MSSA pneumonia treated we will continue present management 3. Delirium no change 4. Cerebellar infarction at baseline 5. Tracheostomy remains in place   I have personally seen and evaluated the patient, evaluated laboratory and imaging results, formulated the assessment and plan and placed orders. The Patient requires high complexity decision making with multiple systems involvement.  Rounds were done with the Respiratory Therapy Director and Staff therapists and discussed with nursing staff also.  12/13/19, MD [facility] Pulmonary Critical Care Medicine Sleep Medicine

## 2019-12-14 DIAGNOSIS — J15211 Pneumonia due to Methicillin susceptible Staphylococcus aureus: Secondary | ICD-10-CM | POA: Diagnosis not present

## 2019-12-14 DIAGNOSIS — J9621 Acute and chronic respiratory failure with hypoxia: Secondary | ICD-10-CM | POA: Diagnosis not present

## 2019-12-14 DIAGNOSIS — F05 Delirium due to known physiological condition: Secondary | ICD-10-CM | POA: Diagnosis not present

## 2019-12-14 DIAGNOSIS — I63549 Cerebral infarction due to unspecified occlusion or stenosis of unspecified cerebellar artery: Secondary | ICD-10-CM | POA: Diagnosis not present

## 2019-12-14 NOTE — Progress Notes (Signed)
Pulmonary Critical Care Medicine [facility] [place]   PULMONARY CRITICAL CARE SERVICE  PROGRESS NOTE  Date of Service: 12/14/2019  Treyden Hakim  EYC:[id number]  DOB: 02-15-1946   DOA: 11/18/2019  Referring Physician: Carron Curie, MD  HPI: Deric Bocock is a 74 y.o. male seen for follow up of Acute on Chronic Respiratory Failure.  Patient is pressure support mode right now on 28% FiO2 currently is requiring 12/5 on the pressure support  Medications: Reviewed on Rounds  Physical Exam:  Vitals: Temperature 99.0 pulse 70 respiratory rate 13 blood pressure is 113/65 saturations 99%  Ventilator Settings mode ventilation pressure support FiO2 20% tidal line 300 pressure 12 PEEP 5  . General: Comfortable at this time . Eyes: Grossly normal lids, irises & conjunctiva . ENT: grossly tongue is normal . Neck: no obvious mass . Cardiovascular: S1 S2 normal no gallop . Respiratory: No rhonchi no rales are noted at this time . Abdomen: soft . Skin: no rash seen on limited exam . Musculoskeletal: not rigid . Psychiatric:unable to assess . Neurologic: no seizure no involuntary movements         Lab Data:   Basic Metabolic Panel: Recent Labs  Lab 12/08/19 0548 12/11/19 0742 12/13/19 0635  NA 136 138 140  K 4.0 4.4 3.6  CL 98 100 99  CO2 28 29 32  GLUCOSE 164* 147* 111*  BUN 12 14 19   CREATININE 0.65 0.58* 0.65  CALCIUM 8.6* 8.5* 8.5*  MG 1.9 2.1  --     ABG: No results for input(s): PHART, PCO2ART, PO2ART, HCO3, O2SAT in the last 168 hours.  Liver Function Tests: No results for input(s): AST, ALT, ALKPHOS, BILITOT, PROT, ALBUMIN in the last 168 hours. No results for input(s): LIPASE, AMYLASE in the last 168 hours. No results for input(s): AMMONIA in the last 168 hours.  CBC: Recent Labs  Lab 12/08/19 0548 12/11/19 0742  WBC 11.9* 11.1*  HGB 10.3* 10.1*  HCT 32.5* 31.8*  MCV 94.8 94.9  PLT 438* 412*    Cardiac Enzymes: No results for  input(s): CKTOTAL, CKMB, CKMBINDEX, TROPONINI in the last 168 hours.  BNP (last 3 results) No results for input(s): BNP in the last 8760 hours.  ProBNP (last 3 results) No results for input(s): PROBNP in the last 8760 hours.  Radiological Exams: No results found.  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 Acute on chronic respiratory failure with hypoxia plan is to continue with full support on pressure support mode titrate as tolerated advance to wean as tolerated 2. MSSA pneumonia treated we will continue with present management 3. Tracheostomy remains in place 4. Delirium no change 5. Cerebellar infarction at baseline continue with supportive care   I have personally seen and evaluated the patient, evaluated laboratory and imaging results, formulated the assessment and plan and placed orders. The Patient requires high complexity decision making with multiple systems involvement.  Rounds were done with the Respiratory Therapy Director and Staff therapists and discussed with nursing staff also.  12/13/19, MD [facility] Pulmonary Critical Care Medicine Sleep Medicine

## 2019-12-15 DIAGNOSIS — J9621 Acute and chronic respiratory failure with hypoxia: Secondary | ICD-10-CM | POA: Diagnosis not present

## 2019-12-15 DIAGNOSIS — I63549 Cerebral infarction due to unspecified occlusion or stenosis of unspecified cerebellar artery: Secondary | ICD-10-CM | POA: Diagnosis not present

## 2019-12-15 DIAGNOSIS — J15211 Pneumonia due to Methicillin susceptible Staphylococcus aureus: Secondary | ICD-10-CM | POA: Diagnosis not present

## 2019-12-15 DIAGNOSIS — F05 Delirium due to known physiological condition: Secondary | ICD-10-CM | POA: Diagnosis not present

## 2019-12-15 LAB — CBC
HCT: 33.5 % — ABNORMAL LOW (ref 39.0–52.0)
Hemoglobin: 10.6 g/dL — ABNORMAL LOW (ref 13.0–17.0)
MCH: 30.3 pg (ref 26.0–34.0)
MCHC: 31.6 g/dL (ref 30.0–36.0)
MCV: 95.7 fL (ref 80.0–100.0)
Platelets: 398 10*3/uL (ref 150–400)
RBC: 3.5 MIL/uL — ABNORMAL LOW (ref 4.22–5.81)
RDW: 15.8 % — ABNORMAL HIGH (ref 11.5–15.5)
WBC: 11.2 10*3/uL — ABNORMAL HIGH (ref 4.0–10.5)
nRBC: 0 % (ref 0.0–0.2)

## 2019-12-15 LAB — BASIC METABOLIC PANEL
Anion gap: 10 (ref 5–15)
BUN: 20 mg/dL (ref 8–23)
CO2: 31 mmol/L (ref 22–32)
Calcium: 8.4 mg/dL — ABNORMAL LOW (ref 8.9–10.3)
Chloride: 97 mmol/L — ABNORMAL LOW (ref 98–111)
Creatinine, Ser: 0.59 mg/dL — ABNORMAL LOW (ref 0.61–1.24)
GFR calc Af Amer: >60 mL/min (ref >60)
GFR calc non Af Amer: >60 mL/min (ref >60)
Glucose, Bld: 154 mg/dL — ABNORMAL HIGH (ref 70–99)
Potassium: 3.9 mmol/L (ref 3.5–5.1)
Sodium: 138 mmol/L (ref 135–145)

## 2019-12-15 LAB — MAGNESIUM: Magnesium: 2 mg/dL (ref 1.7–2.4)

## 2019-12-15 NOTE — Progress Notes (Signed)
Pulmonary Critical Care Medicine [facility] [place]   PULMONARY CRITICAL CARE SERVICE  PROGRESS NOTE  Date of Service: 12/15/2019  Eeshan Verbrugge  QVZ:[id number]  DOB: 1946-11-26   DOA: 12-05-19  Referring Physician: Carron Curie, MD  HPI: Jkwon Treptow is a 74 y.o. male seen for follow up of Acute on Chronic Respiratory Failure.  Patient is on full support was attempted on pressure support once again but patient did not tolerate  Medications: Reviewed on Rounds  Physical Exam:  Vitals: Temperature 97.7 pulse 68 respiratory 23 blood pressure is 138/64 saturations 97%  Ventilator Settings mode ventilation assist control FiO2 28% tidal volume 419 PEEP 5  . General: Comfortable at this time . Eyes: Grossly normal lids, irises & conjunctiva . ENT: grossly tongue is normal . Neck: no obvious mass . Cardiovascular: S1 S2 normal no gallop . Respiratory: No rhonchi no rales are noted at this time . Abdomen: soft . Skin: no rash seen on limited exam . Musculoskeletal: not rigid . Psychiatric:unable to assess . Neurologic: no seizure no involuntary movements         Lab Data:   Basic Metabolic Panel: Recent Labs  Lab 12/11/19 0742 12/13/19 0635 12/15/19 0600  NA 138 140 138  K 4.4 3.6 3.9  CL 100 99 97*  CO2 29 32 31  GLUCOSE 147* 111* 154*  BUN 14 19 20   CREATININE 0.58* 0.65 0.59*  CALCIUM 8.5* 8.5* 8.4*  MG 2.1  --  2.0    ABG: No results for input(s): PHART, PCO2ART, PO2ART, HCO3, O2SAT in the last 168 hours.  Liver Function Tests: No results for input(s): AST, ALT, ALKPHOS, BILITOT, PROT, ALBUMIN in the last 168 hours. No results for input(s): LIPASE, AMYLASE in the last 168 hours. No results for input(s): AMMONIA in the last 168 hours.  CBC: Recent Labs  Lab 12/11/19 0742 12/15/19 0600  WBC 11.1* 11.2*  HGB 10.1* 10.6*  HCT 31.8* 33.5*  MCV 94.9 95.7  PLT 412* 398    Cardiac Enzymes: No results for input(s): CKTOTAL,  CKMB, CKMBINDEX, TROPONINI in the last 168 hours.  BNP (last 3 results) No results for input(s): BNP in the last 8760 hours.  ProBNP (last 3 results) No results for input(s): PROBNP in the last 8760 hours.  Radiological Exams: No results found.  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 Acute on chronic respiratory failure hypoxia plan is to continue with weaning trials as tolerated.  The patient was attempted on pressure support but failed 2. MSSA pneumonia treated we will continue supportive care 3. Delirium no change 4. Cerebellar infarction patient is at baseline 5. Tracheostomy remains in place   I have personally seen and evaluated the patient, evaluated laboratory and imaging results, formulated the assessment and plan and placed orders. The Patient requires high complexity decision making with multiple systems involvement.  Rounds were done with the Respiratory Therapy Director and Staff therapists and discussed with nursing staff also.  12/17/19, MD [facility] Pulmonary Critical Care Medicine Sleep Medicine

## 2019-12-16 DIAGNOSIS — J9621 Acute and chronic respiratory failure with hypoxia: Secondary | ICD-10-CM | POA: Diagnosis not present

## 2019-12-16 DIAGNOSIS — J15211 Pneumonia due to Methicillin susceptible Staphylococcus aureus: Secondary | ICD-10-CM | POA: Diagnosis not present

## 2019-12-16 DIAGNOSIS — I63549 Cerebral infarction due to unspecified occlusion or stenosis of unspecified cerebellar artery: Secondary | ICD-10-CM | POA: Diagnosis not present

## 2019-12-16 DIAGNOSIS — F05 Delirium due to known physiological condition: Secondary | ICD-10-CM | POA: Diagnosis not present

## 2019-12-16 NOTE — Progress Notes (Signed)
Pulmonary Critical Care Medicine [facility] [place]   PULMONARY CRITICAL CARE SERVICE  PROGRESS NOTE  Date of Service: 12/16/2019  Jessup Ogas  [id number]:[id number]  DOB: [birth year]   DOA: 11/26/2019  Referring Physician: Carron Curie, MD  HPI: Eldo Umanzor is a 74 y.o. male seen for follow up of Acute on Chronic Respiratory Failure.  Patient right now is on full support on assist control mode on 28% FiO2 with a PEEP of 5  Medications: Reviewed on Rounds  Physical Exam:  Vitals: Temperature 99.0 pulse 81 respiratory 30 blood pressure is 122/78 saturation 95%  Ventilator Settings mode ventilation assist control FiO2 28% tidal line 389 PEEP 5  . General: Comfortable at this time . Eyes: Grossly normal lids, irises & conjunctiva . ENT: grossly tongue is normal . Neck: no obvious mass . Cardiovascular: S1 S2 normal no gallop . Respiratory: No rhonchi no rales are noted at this time . Abdomen: soft . Skin: no rash seen on limited exam . Musculoskeletal: not rigid . Psychiatric:unable to assess . Neurologic: no seizure no involuntary movements         Lab Data:   Basic Metabolic Panel: Recent Labs  Lab 12/11/19 0742 12/13/19 0635 12/15/19 0600  NA 138 140 138  K 4.4 3.6 3.9  CL 100 99 97*  CO2 29 32 31  GLUCOSE 147* 111* 154*  BUN 14 19 20   CREATININE 0.58* 0.65 0.59*  CALCIUM 8.5* 8.5* 8.4*  MG 2.1  --  2.0    ABG: No results for input(s): PHART, PCO2ART, PO2ART, HCO3, O2SAT in the last 168 hours.  Liver Function Tests: No results for input(s): AST, ALT, ALKPHOS, BILITOT, PROT, ALBUMIN in the last 168 hours. No results for input(s): LIPASE, AMYLASE in the last 168 hours. No results for input(s): AMMONIA in the last 168 hours.  CBC: Recent Labs  Lab 12/11/19 0742 12/15/19 0600  WBC 11.1* 11.2*  HGB 10.1* 10.6*  HCT 31.8* 33.5*  MCV 94.9 95.7  PLT 412* 398    Cardiac Enzymes: No results for input(s): CKTOTAL, CKMB, CKMBINDEX,  TROPONINI in the last 168 hours.  BNP (last 3 results) No results for input(s): BNP in the last 8760 hours.  ProBNP (last 3 results) No results for input(s): PROBNP in the last 8760 hours.  Radiological Exams: No results found.  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 Acute on chronic respiratory failure hypoxia plan continue with full support on assist control 2. MSSA pneumonia treated we will continue with supportive care 3. Delirium no changes 4. Tracheostomy remains in place 5. Cerebellar infarction at baseline   I have personally seen and evaluated the patient, evaluated laboratory and imaging results, formulated the assessment and plan and placed orders. The Patient requires high complexity decision making with multiple systems involvement.  Rounds were done with the Respiratory Therapy Director and Staff therapists and discussed with nursing staff also.  12/17/19, MD [facility] Pulmonary Critical Care Medicine Sleep Medicine

## 2019-12-17 ENCOUNTER — Other Ambulatory Visit (HOSPITAL_COMMUNITY): Payer: Medicare Other

## 2019-12-17 DIAGNOSIS — F05 Delirium due to known physiological condition: Secondary | ICD-10-CM | POA: Diagnosis not present

## 2019-12-17 DIAGNOSIS — J15211 Pneumonia due to Methicillin susceptible Staphylococcus aureus: Secondary | ICD-10-CM | POA: Diagnosis not present

## 2019-12-17 DIAGNOSIS — I63549 Cerebral infarction due to unspecified occlusion or stenosis of unspecified cerebellar artery: Secondary | ICD-10-CM | POA: Diagnosis not present

## 2019-12-17 DIAGNOSIS — J9621 Acute and chronic respiratory failure with hypoxia: Secondary | ICD-10-CM | POA: Diagnosis not present

## 2019-12-17 NOTE — Progress Notes (Signed)
Pulmonary Critical Care Medicine [facility] [place]   PULMONARY CRITICAL CARE SERVICE  PROGRESS NOTE  Date of Service: 12/17/2019  Melvin Howard  [id number]  DOB: 03/04/1946   DOA: 11/05/2019  Referring Physician: Carron Curie, MD  HPI: Melvin Howard is a 74 y.o. male seen for follow up of Acute on Chronic Respiratory Failure.  Patient currently is on full support on the ventilator has been failing attempts at weaning right now is on assist control mode  Medications: Reviewed on Rounds  Physical Exam:  Vitals: Temperature 99.1 pulse 81 respiratory 26 blood pressure is 124/43 saturations 99%  Ventilator Settings mode ventilation assist control FiO2 20% tidal line 472 PEEP 5  . General: Comfortable at this time . Eyes: Grossly normal lids, irises & conjunctiva . ENT: grossly tongue is normal . Neck: no obvious mass . Cardiovascular: S1 S2 normal no gallop . Respiratory: No rhonchi no rales are noted at this time . Abdomen: soft . Skin: no rash seen on limited exam . Musculoskeletal: not rigid . Psychiatric:unable to assess . Neurologic: no seizure no involuntary movements         Lab Data:   Basic Metabolic Panel: Recent Labs  Lab 12/11/19 0742 12/13/19 0635 12/15/19 0600  NA 138 140 138  K 4.4 3.6 3.9  CL 100 99 97*  CO2 29 32 31  GLUCOSE 147* 111* 154*  BUN 14 19 20   CREATININE 0.58* 0.65 0.59*  CALCIUM 8.5* 8.5* 8.4*  MG 2.1  --  2.0    ABG: No results for input(s): PHART, PCO2ART, PO2ART, HCO3, O2SAT in the last 168 hours.  Liver Function Tests: No results for input(s): AST, ALT, ALKPHOS, BILITOT, PROT, ALBUMIN in the last 168 hours. No results for input(s): LIPASE, AMYLASE in the last 168 hours. No results for input(s): AMMONIA in the last 168 hours.  CBC: Recent Labs  Lab 12/11/19 0742 12/15/19 0600  WBC 11.1* 11.2*  HGB 10.1* 10.6*  HCT 31.8* 33.5*  MCV 94.9 95.7  PLT 412* 398    Cardiac Enzymes: No results  for input(s): CKTOTAL, CKMB, CKMBINDEX, TROPONINI in the last 168 hours.  BNP (last 3 results) No results for input(s): BNP in the last 8760 hours.  ProBNP (last 3 results) No results for input(s): PROBNP in the last 8760 hours.  Radiological Exams: No results found.  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 Acute on chronic respiratory failure hypoxia plan continue with assist control mode on 28% FiO2 good volumes are noted 2. MSSA pneumonia treated we will continue to follow 3. Tracheostomy remains in place 4. Delirium no change 5. Cerebellar infarction patient remains unchanged   I have personally seen and evaluated the patient, evaluated laboratory and imaging results, formulated the assessment and plan and placed orders. The Patient requires high complexity decision making with multiple systems involvement.  Rounds were done with the Respiratory Therapy Director and Staff therapists and discussed with nursing staff also.  12/17/19, MD [facility] Pulmonary Critical Care Medicine Sleep Medicine

## 2019-12-18 DIAGNOSIS — J9621 Acute and chronic respiratory failure with hypoxia: Secondary | ICD-10-CM | POA: Diagnosis not present

## 2019-12-18 DIAGNOSIS — F05 Delirium due to known physiological condition: Secondary | ICD-10-CM | POA: Diagnosis not present

## 2019-12-18 DIAGNOSIS — I63549 Cerebral infarction due to unspecified occlusion or stenosis of unspecified cerebellar artery: Secondary | ICD-10-CM | POA: Diagnosis not present

## 2019-12-18 DIAGNOSIS — J15211 Pneumonia due to Methicillin susceptible Staphylococcus aureus: Secondary | ICD-10-CM | POA: Diagnosis not present

## 2019-12-18 LAB — CBC
HCT: 33.3 % — ABNORMAL LOW (ref 39.0–52.0)
Hemoglobin: 10.5 g/dL — ABNORMAL LOW (ref 13.0–17.0)
MCH: 29.7 pg (ref 26.0–34.0)
MCHC: 31.5 g/dL (ref 30.0–36.0)
MCV: 94.1 fL (ref 80.0–100.0)
Platelets: 377 10*3/uL (ref 150–400)
RBC: 3.54 MIL/uL — ABNORMAL LOW (ref 4.22–5.81)
RDW: 15.5 % (ref 11.5–15.5)
WBC: 10.4 10*3/uL (ref 4.0–10.5)
nRBC: 0 % (ref 0.0–0.2)

## 2019-12-18 LAB — MAGNESIUM: Magnesium: 2 mg/dL (ref 1.7–2.4)

## 2019-12-18 LAB — BASIC METABOLIC PANEL
Anion gap: 10 (ref 5–15)
BUN: 19 mg/dL (ref 8–23)
CO2: 29 mmol/L (ref 22–32)
Calcium: 8.3 mg/dL — ABNORMAL LOW (ref 8.9–10.3)
Chloride: 100 mmol/L (ref 98–111)
Creatinine, Ser: 0.7 mg/dL (ref 0.61–1.24)
GFR calc Af Amer: >60 mL/min (ref >60)
GFR calc non Af Amer: >60 mL/min (ref >60)
Glucose, Bld: 117 mg/dL — ABNORMAL HIGH (ref 70–99)
Potassium: 4.1 mmol/L (ref 3.5–5.1)
Sodium: 139 mmol/L (ref 135–145)

## 2019-12-18 LAB — VALPROIC ACID LEVEL: Valproic Acid Lvl: 40 ug/mL — ABNORMAL LOW (ref 50.0–100.0)

## 2019-12-18 LAB — PHOSPHORUS: Phosphorus: 3.8 mg/dL (ref 2.5–4.6)

## 2019-12-18 NOTE — Progress Notes (Signed)
Pulmonary Critical Care Medicine [facility] [place]   PULMONARY CRITICAL CARE SERVICE  PROGRESS NOTE  Date of Service: 12/18/2019  Melvin Howard  DDU:[id number]  DOB: [birth year]   DOA: 2019-12-08  Referring Physician: Carron Curie, MD  HPI: Melvin Howard is a 74 y.o. male seen for follow up of Acute on Chronic Respiratory Failure.  Patient currently speaking full support assist control mode right now is on 28% FiO2 did about 2 hours of pressure support  Medications: Reviewed on Rounds  Physical Exam:  Vitals: Temperature 98.6 pulse 68 respiratory rate 22 blood pressure is 113/72 saturations 96%  Ventilator Settings mode ventilation assist control FiO2 20% tidal volume 400 PEEP 5  . General: Comfortable at this time . Eyes: Grossly normal lids, irises & conjunctiva . ENT: grossly tongue is normal . Neck: no obvious mass . Cardiovascular: S1 S2 normal no gallop . Respiratory: No rhonchi coarse breath sounds . Abdomen: soft . Skin: no rash seen on limited exam . Musculoskeletal: not rigid . Psychiatric:unable to assess . Neurologic: no seizure no involuntary movements         Lab Data:   Basic Metabolic Panel: Recent Labs  Lab 12/13/19 0635 12/15/19 0600 12/18/19 0532  NA 140 138 139  K 3.6 3.9 4.1  CL 99 97* 100  CO2 32 31 29  GLUCOSE 111* 154* 117*  BUN 19 20 19   CREATININE 0.65 0.59* 0.70  CALCIUM 8.5* 8.4* 8.3*  MG  --  2.0 2.0  PHOS  --   --  3.8    ABG: No results for input(s): PHART, PCO2ART, PO2ART, HCO3, O2SAT in the last 168 hours.  Liver Function Tests: No results for input(s): AST, ALT, ALKPHOS, BILITOT, PROT, ALBUMIN in the last 168 hours. No results for input(s): LIPASE, AMYLASE in the last 168 hours. No results for input(s): AMMONIA in the last 168 hours.  CBC: Recent Labs  Lab 12/15/19 0600 12/18/19 0532  WBC 11.2* 10.4  HGB 10.6* 10.5*  HCT 33.5* 33.3*  MCV 95.7 94.1  PLT 398 377    Cardiac Enzymes: No  results for input(s): CKTOTAL, CKMB, CKMBINDEX, TROPONINI in the last 168 hours.  BNP (last 3 results) No results for input(s): BNP in the last 8760 hours.  ProBNP (last 3 results) No results for input(s): PROBNP in the last 8760 hours.  Radiological Exams: DG CHEST PORT 1 VIEW  Result Date: 12/17/2019 CLINICAL DATA:  Pneumonia. EXAM: PORTABLE CHEST 1 VIEW COMPARISON:  December 04, 2019. FINDINGS: Stable cardiomegaly. Tracheostomy tube appears normal. Right lung is clear. Possible mild left basilar subsegmental atelectasis is noted. No pneumothorax is noted. Bony thorax is unremarkable. IMPRESSION: Possible mild left basilar subsegmental atelectasis. Stable tracheostomy tube. Electronically Signed   By: December 06, 2019 M.D.   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 Acute on chronic respiratory failure hypoxia plan is to continue with full support on assist control mode patient is on wean protocol did 2 hours of pressure support as already mentioned 2. MSSA pneumonia treated we will continue with supportive care 3. Delirium no changes are noted 4. Cerebellar infarction at baseline 5. Tracheostomy remains in place   I have personally seen and evaluated the patient,  evaluated laboratory and imaging results, formulated the assessment and plan and placed orders. The Patient requires high complexity decision making with multiple systems involvement.  Rounds were done with the Respiratory Therapy Director and Staff therapists and discussed with nursing staff also.  Allyne Gee, MD [facility] Pulmonary Critical Care Medicine Sleep Medicine

## 2019-12-19 DIAGNOSIS — F05 Delirium due to known physiological condition: Secondary | ICD-10-CM | POA: Diagnosis not present

## 2019-12-19 DIAGNOSIS — I63549 Cerebral infarction due to unspecified occlusion or stenosis of unspecified cerebellar artery: Secondary | ICD-10-CM | POA: Diagnosis not present

## 2019-12-19 DIAGNOSIS — J15211 Pneumonia due to Methicillin susceptible Staphylococcus aureus: Secondary | ICD-10-CM | POA: Diagnosis not present

## 2019-12-19 DIAGNOSIS — J9621 Acute and chronic respiratory failure with hypoxia: Secondary | ICD-10-CM | POA: Diagnosis not present

## 2019-12-19 NOTE — Progress Notes (Signed)
Pulmonary Critical Care Medicine [facility] [place]   PULMONARY CRITICAL CARE SERVICE  PROGRESS NOTE  Date of Service: 12/19/2019  Izreal Kock  RFF:[id number]  DOB: 1946-11-17   DOA: 11-30-19  Referring Physician: Carron Curie, MD  HPI: Ohm Dentler is a 74 y.o. male seen for follow up of Acute on Chronic Respiratory Failure.  Patient is currently on pressure support mode has been on 28% FiO2 with good saturations  Medications: Reviewed on Rounds  Physical Exam:  Vitals: Pressure 97.4 pulse 71 respiratory rate 17 blood pressure is 117/65 saturations 98%  Ventilator Settings mode of ventilation pressure support FiO2 28% 325 pressure poor 12 PEEP 5  . General: Comfortable at this time . Eyes: Grossly normal lids, irises & conjunctiva . ENT: grossly tongue is normal . Neck: no obvious mass . Cardiovascular: S1 S2 normal no gallop . Respiratory: No rhonchi coarse breath sounds . Abdomen: soft . Skin: no rash seen on limited exam . Musculoskeletal: not rigid . Psychiatric:unable to assess . Neurologic: no seizure no involuntary movements         Lab Data:   Basic Metabolic Panel: Recent Labs  Lab 12/13/19 0635 12/15/19 0600 12/18/19 0532  NA 140 138 139  K 3.6 3.9 4.1  CL 99 97* 100  CO2 32 31 29  GLUCOSE 111* 154* 117*  BUN 19 20 19   CREATININE 0.65 0.59* 0.70  CALCIUM 8.5* 8.4* 8.3*  MG  --  2.0 2.0  PHOS  --   --  3.8    ABG: No results for input(s): PHART, PCO2ART, PO2ART, HCO3, O2SAT in the last 168 hours.  Liver Function Tests: No results for input(s): AST, ALT, ALKPHOS, BILITOT, PROT, ALBUMIN in the last 168 hours. No results for input(s): LIPASE, AMYLASE in the last 168 hours. No results for input(s): AMMONIA in the last 168 hours.  CBC: Recent Labs  Lab 12/15/19 0600 12/18/19 0532  WBC 11.2* 10.4  HGB 10.6* 10.5*  HCT 33.5* 33.3*  MCV 95.7 94.1  PLT 398 377    Cardiac Enzymes: No results for input(s):  CKTOTAL, CKMB, CKMBINDEX, TROPONINI in the last 168 hours.  BNP (last 3 results) No results for input(s): BNP in the last 8760 hours.  ProBNP (last 3 results) No results for input(s): PROBNP in the last 8760 hours.  Radiological Exams: No results found.  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 Acute on chronic respiratory failure with hypoxia patient is on pressure support.  Currently on 20% FiO2 pressure support is 12 PEEP 5 continue with present management 2. MSSA pneumonia treated improving 3. Delirium no change 4. Tracheostomy status remains in place 5. Cerebellar infarction no changes are noted at this time   I have personally seen and evaluated the patient, evaluated laboratory and imaging results, formulated the assessment and plan and placed orders. The Patient requires high complexity decision making with multiple systems involvement.  Rounds were done with the Respiratory Therapy Director and Staff therapists and discussed with nursing staff also.  12/20/19, MD [facility] Pulmonary Critical Care Medicine Sleep Medicine

## 2019-12-20 ENCOUNTER — Other Ambulatory Visit (HOSPITAL_COMMUNITY): Payer: Medicare Other

## 2019-12-20 DIAGNOSIS — F05 Delirium due to known physiological condition: Secondary | ICD-10-CM | POA: Diagnosis not present

## 2019-12-20 DIAGNOSIS — J15211 Pneumonia due to Methicillin susceptible Staphylococcus aureus: Secondary | ICD-10-CM | POA: Diagnosis not present

## 2019-12-20 DIAGNOSIS — J9621 Acute and chronic respiratory failure with hypoxia: Secondary | ICD-10-CM | POA: Diagnosis not present

## 2019-12-20 DIAGNOSIS — I63549 Cerebral infarction due to unspecified occlusion or stenosis of unspecified cerebellar artery: Secondary | ICD-10-CM | POA: Diagnosis not present

## 2019-12-20 LAB — BASIC METABOLIC PANEL
Anion gap: 10 (ref 5–15)
BUN: 11 mg/dL (ref 8–23)
CO2: 26 mmol/L (ref 22–32)
Calcium: 8.4 mg/dL — ABNORMAL LOW (ref 8.9–10.3)
Chloride: 104 mmol/L (ref 98–111)
Creatinine, Ser: 0.56 mg/dL — ABNORMAL LOW (ref 0.61–1.24)
GFR calc Af Amer: >60 mL/min (ref >60)
GFR calc non Af Amer: >60 mL/min (ref >60)
Glucose, Bld: 118 mg/dL — ABNORMAL HIGH (ref 70–99)
Potassium: 3.7 mmol/L (ref 3.5–5.1)
Sodium: 140 mmol/L (ref 135–145)

## 2019-12-20 LAB — CBC
HCT: 32.2 % — ABNORMAL LOW (ref 39.0–52.0)
Hemoglobin: 10 g/dL — ABNORMAL LOW (ref 13.0–17.0)
MCH: 29.8 pg (ref 26.0–34.0)
MCHC: 31.1 g/dL (ref 30.0–36.0)
MCV: 95.8 fL (ref 80.0–100.0)
Platelets: 353 10*3/uL (ref 150–400)
RBC: 3.36 MIL/uL — ABNORMAL LOW (ref 4.22–5.81)
RDW: 15.4 % (ref 11.5–15.5)
WBC: 9.4 10*3/uL (ref 4.0–10.5)
nRBC: 0 % (ref 0.0–0.2)

## 2019-12-20 LAB — CULTURE, RESPIRATORY W GRAM STAIN

## 2019-12-20 LAB — MAGNESIUM: Magnesium: 1.9 mg/dL (ref 1.7–2.4)

## 2019-12-20 LAB — PHOSPHORUS: Phosphorus: 3.8 mg/dL (ref 2.5–4.6)

## 2019-12-20 NOTE — Progress Notes (Signed)
Pulmonary Critical Care Medicine [facility] [place]   PULMONARY CRITICAL CARE SERVICE  PROGRESS NOTE  Date of Service: 12/20/2019  Melvin Howard  IDP:[id number]  DOB: 1946-01-29   DOA: 12-02-19  Referring Physician: Carron Curie, MD  HPI: Melvin Howard is a 74 y.o. male seen for follow up of Acute on Chronic Respiratory Failure.  Patient is on full support was attempted on pressure support did not tolerate  Medications: Reviewed on Rounds  Physical Exam:  Vitals: Temperature 97.9 pulse 84 respiratory rate 28 blood pressure is 105/63 saturations 97%  Ventilator Settings mode ventilation assist control FiO2 20% tidal line 450 PEEP 5  . General: Comfortable at this time . Eyes: Grossly normal lids, irises & conjunctiva . ENT: grossly tongue is normal . Neck: no obvious mass . Cardiovascular: S1 S2 normal no gallop . Respiratory: No rhonchi no rales are noted at this time . Abdomen: soft . Skin: no rash seen on limited exam . Musculoskeletal: not rigid . Psychiatric:unable to assess . Neurologic: no seizure no involuntary movements         Lab Data:   Basic Metabolic Panel: Recent Labs  Lab 12/15/19 0600 12/18/19 0532 12/20/19 0627  NA 138 139 140  K 3.9 4.1 3.7  CL 97* 100 104  CO2 31 29 26   GLUCOSE 154* 117* 118*  BUN 20 19 11   CREATININE 0.59* 0.70 0.56*  CALCIUM 8.4* 8.3* 8.4*  MG 2.0 2.0 1.9  PHOS  --  3.8 3.8    ABG: No results for input(s): PHART, PCO2ART, PO2ART, HCO3, O2SAT in the last 168 hours.  Liver Function Tests: No results for input(s): AST, ALT, ALKPHOS, BILITOT, PROT, ALBUMIN in the last 168 hours. No results for input(s): LIPASE, AMYLASE in the last 168 hours. No results for input(s): AMMONIA in the last 168 hours.  CBC: Recent Labs  Lab 12/15/19 0600 12/18/19 0532 12/20/19 0627  WBC 11.2* 10.4 9.4  HGB 10.6* 10.5* 10.0*  HCT 33.5* 33.3* 32.2*  MCV 95.7 94.1 95.8  PLT 398 377 353    Cardiac  Enzymes: No results for input(s): CKTOTAL, CKMB, CKMBINDEX, TROPONINI in the last 168 hours.  BNP (last 3 results) No results for input(s): BNP in the last 8760 hours.  ProBNP (last 3 results) No results for input(s): PROBNP in the last 8760 hours.  Radiological Exams: DG CHEST PORT 1 VIEW  Result Date: 12/20/2019 CLINICAL DATA:  Update status, prior history of pneumonia EXAM: PORTABLE CHEST 1 VIEW COMPARISON:  12/17/2019 FINDINGS: Lingular/left lower lobe opacity, suspicious for pneumonia, new. Right lung is clear. Possible small left pleural effusion. No pneumothorax. Tracheostomy in satisfactory position. The heart is top-normal in size. IMPRESSION: Lingular/left lower lobe opacity, suspicious for pneumonia, new. Possible small left pleural effusion. Electronically Signed   By: 12/22/2019 M.D.   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 Acute on chronic respiratory failure hypoxia plan is to continue with full support check the RSB I again in the morning 2. MSSA pneumonia treated improving 3. Tracheostomy remains in place 4. Delirium no change 5. Cerebellar infarction patient does not appear to show any improvement   I have personally seen and evaluated the patient, evaluated laboratory and  imaging results, formulated the assessment and plan and placed orders. The Patient requires high complexity decision making with multiple systems involvement.  Rounds were done with the Respiratory Therapy Director and Staff therapists and discussed with nursing staff also.  Allyne Gee, MD [facility] Pulmonary Critical Care Medicine Sleep Medicine

## 2019-12-21 DIAGNOSIS — I63549 Cerebral infarction due to unspecified occlusion or stenosis of unspecified cerebellar artery: Secondary | ICD-10-CM | POA: Diagnosis not present

## 2019-12-21 DIAGNOSIS — J9621 Acute and chronic respiratory failure with hypoxia: Secondary | ICD-10-CM | POA: Diagnosis not present

## 2019-12-21 DIAGNOSIS — F05 Delirium due to known physiological condition: Secondary | ICD-10-CM | POA: Diagnosis not present

## 2019-12-21 DIAGNOSIS — J15211 Pneumonia due to Methicillin susceptible Staphylococcus aureus: Secondary | ICD-10-CM | POA: Diagnosis not present

## 2019-12-21 NOTE — Progress Notes (Addendum)
Pulmonary Critical Care Medicine [place]   PULMONARY CRITICAL CARE SERVICE  PROGRESS NOTE  Date of Service: 12/21/2019  Melvin Howard  JYN:[id number]  DOB: [birth year]   DOA: 2019-12-16  Referring Physician: Merton Border, MD  HPI: Melvin Howard is a 74 y.o. male seen for follow up of Acute on Chronic Respiratory Failure.  Patient did 3 hours of pressure support 12/5 today 28% FiO2 as per goal.  Now resting back on assist control mode rate 15 with FiO2 20% satting well no fever or distress.  Medications: Reviewed on Rounds  Physical Exam:  Vitals: Pulse 64 respirations 23 BP 118/75 O2 sat 100% temp 98.0   Ventilator Settings assist control mode rate of 15 tidal volume 450 PEEP of 5 FiO2 28%  . General: Comfortable at this time . Eyes: Grossly normal lids, irises & conjunctiva . ENT: grossly tongue is normal . Neck: no obvious mass . Cardiovascular: S1 S2 normal no gallop . Respiratory: No rales or rhonchi noted . Abdomen: soft . Skin: no rash seen on limited exam . Musculoskeletal: not rigid . Psychiatric:unable to assess . Neurologic: no seizure no involuntary movements         Lab Data:   Basic Metabolic Panel: Recent Labs  Lab 12/15/19 0600 12/18/19 0532 12/20/19 0627  NA 138 139 140  K 3.9 4.1 3.7  CL 97* 100 104  CO2 31 29 26   GLUCOSE 154* 117* 118*  BUN 20 19 11   CREATININE 0.59* 0.70 0.56*  CALCIUM 8.4* 8.3* 8.4*  MG 2.0 2.0 1.9  PHOS  --  3.8 3.8    ABG: No results for input(s): PHART, PCO2ART, PO2ART, HCO3, O2SAT in the last 168 hours.  Liver Function Tests: No results for input(s): AST, ALT, ALKPHOS, BILITOT, PROT, ALBUMIN in the last 168 hours. No results for input(s): LIPASE, AMYLASE in the last 168 hours. No results for input(s): AMMONIA in the last 168 hours.  CBC: Recent Labs  Lab 12/15/19 0600 12/18/19 0532 12/20/19 0627  WBC 11.2* 10.4 9.4  HGB 10.6* 10.5* 10.0*  HCT 33.5* 33.3* 32.2*  MCV 95.7  94.1 95.8  PLT 398 377 353    Cardiac Enzymes: No results for input(s): CKTOTAL, CKMB, CKMBINDEX, TROPONINI in the last 168 hours.  BNP (last 3 results) No results for input(s): BNP in the last 8760 hours.  ProBNP (last 3 results) No results for input(s): PROBNP in the last 8760 hours.  Radiological Exams: DG CHEST PORT 1 VIEW  Result Date: 12/20/2019 CLINICAL DATA:  Update status, prior history of pneumonia EXAM: PORTABLE CHEST 1 VIEW COMPARISON:  12/17/2019 FINDINGS: Lingular/left lower lobe opacity, suspicious for pneumonia, new. Right lung is clear. Possible small left pleural effusion. No pneumothorax. Tracheostomy in satisfactory position. The heart is top-normal in size. IMPRESSION: Lingular/left lower lobe opacity, suspicious for pneumonia, new. Possible small left pleural effusion. Electronically Signed   By: Julian Hy M.D.   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 Acute on chronic respiratory failure hypoxia plan is to continue weaning on pressure support for 4-hour goal tomorrow.  Continue on full support 2. MSSA pneumonia treated improving 3. Tracheostomy remains in place 4. Delirium no change 5. Cerebellar infarction patient  does not appear to show any improvement   I have personally seen and evaluated the patient, evaluated laboratory and imaging results, formulated the assessment and plan and placed orders. The Patient requires high complexity decision making with multiple systems involvement.  Rounds were done with the Respiratory Therapy Director and Staff therapists and discussed with nursing staff also.  Yevonne Pax, MD [facility] Pulmonary Critical Care  Medicine Sleep Medicine

## 2019-12-22 DIAGNOSIS — J15211 Pneumonia due to Methicillin susceptible Staphylococcus aureus: Secondary | ICD-10-CM | POA: Diagnosis not present

## 2019-12-22 DIAGNOSIS — J9621 Acute and chronic respiratory failure with hypoxia: Secondary | ICD-10-CM | POA: Diagnosis not present

## 2019-12-22 DIAGNOSIS — F05 Delirium due to known physiological condition: Secondary | ICD-10-CM | POA: Diagnosis not present

## 2019-12-22 DIAGNOSIS — I63549 Cerebral infarction due to unspecified occlusion or stenosis of unspecified cerebellar artery: Secondary | ICD-10-CM | POA: Diagnosis not present

## 2019-12-22 LAB — CBC
HCT: 35.2 % — ABNORMAL LOW (ref 39.0–52.0)
Hemoglobin: 10.7 g/dL — ABNORMAL LOW (ref 13.0–17.0)
MCH: 29 pg (ref 26.0–34.0)
MCHC: 30.4 g/dL (ref 30.0–36.0)
MCV: 95.4 fL (ref 80.0–100.0)
Platelets: 368 10*3/uL (ref 150–400)
RBC: 3.69 MIL/uL — ABNORMAL LOW (ref 4.22–5.81)
RDW: 15.3 % (ref 11.5–15.5)
WBC: 8.4 10*3/uL (ref 4.0–10.5)
nRBC: 0 % (ref 0.0–0.2)

## 2019-12-22 LAB — VANCOMYCIN, TROUGH: Vancomycin Tr: 15 ug/mL (ref 15–20)

## 2019-12-22 LAB — BASIC METABOLIC PANEL
Anion gap: 12 (ref 5–15)
BUN: 11 mg/dL (ref 8–23)
CO2: 25 mmol/L (ref 22–32)
Calcium: 8.1 mg/dL — ABNORMAL LOW (ref 8.9–10.3)
Chloride: 100 mmol/L (ref 98–111)
Creatinine, Ser: 0.63 mg/dL (ref 0.61–1.24)
GFR calc Af Amer: >60 mL/min (ref >60)
GFR calc non Af Amer: >60 mL/min (ref >60)
Glucose, Bld: 116 mg/dL — ABNORMAL HIGH (ref 70–99)
Potassium: 3.9 mmol/L (ref 3.5–5.1)
Sodium: 137 mmol/L (ref 135–145)

## 2019-12-22 LAB — PHOSPHORUS: Phosphorus: 3.7 mg/dL (ref 2.5–4.6)

## 2019-12-22 LAB — MAGNESIUM: Magnesium: 1.8 mg/dL (ref 1.7–2.4)

## 2019-12-22 NOTE — Progress Notes (Signed)
Pulmonary Critical Care Medicine [facility] [place]   PULMONARY CRITICAL CARE SERVICE  PROGRESS NOTE  Date of Service: 12/22/2019  Melvin Howard  ULA:[id number]  DOB: 11/16/1946   DOA: 11/16/2019  Referring Physician: Carron Curie, MD  HPI: Melvin Howard is a 74 y.o. male seen for follow up of Acute on Chronic Respiratory Failure.  Patient is on pressure support wean actually is doing well the goal is for about 4 hours today  Medications: Reviewed on Rounds  Physical Exam:  Vitals: Temperature 97.3 pulse 88 respiratory rate 22 blood pressure is 116/60 saturations 98%  Ventilator Settings mode ventilation pressure support FiO2 20% pressure 12 PEEP 5 tidal volume 385  . General: Comfortable at this time . Eyes: Grossly normal lids, irises & conjunctiva . ENT: grossly tongue is normal . Neck: no obvious mass . Cardiovascular: S1 S2 normal no gallop . Respiratory: No rhonchi coarse breath sounds noted bilaterally . Abdomen: soft . Skin: no rash seen on limited exam . Musculoskeletal: not rigid . Psychiatric:unable to assess . Neurologic: no seizure no involuntary movements         Lab Data:   Basic Metabolic Panel: Recent Labs  Lab 12/18/19 0532 12/20/19 0627 12/22/19 0550  NA 139 140 137  K 4.1 3.7 3.9  CL 100 104 100  CO2 29 26 25   GLUCOSE 117* 118* 116*  BUN 19 11 11   CREATININE 0.70 0.56* 0.63  CALCIUM 8.3* 8.4* 8.1*  MG 2.0 1.9 1.8  PHOS 3.8 3.8 3.7    ABG: No results for input(s): PHART, PCO2ART, PO2ART, HCO3, O2SAT in the last 168 hours.  Liver Function Tests: No results for input(s): AST, ALT, ALKPHOS, BILITOT, PROT, ALBUMIN in the last 168 hours. No results for input(s): LIPASE, AMYLASE in the last 168 hours. No results for input(s): AMMONIA in the last 168 hours.  CBC: Recent Labs  Lab 12/18/19 0532 12/20/19 0627 12/22/19 0550  WBC 10.4 9.4 8.4  HGB 10.5* 10.0* 10.7*  HCT 33.3* 32.2* 35.2*  MCV 94.1 95.8 95.4  PLT  377 353 368    Cardiac Enzymes: No results for input(s): CKTOTAL, CKMB, CKMBINDEX, TROPONINI in the last 168 hours.  BNP (last 3 results) No results for input(s): BNP in the last 8760 hours.  ProBNP (last 3 results) No results for input(s): PROBNP in the last 8760 hours.  Radiological Exams: No results found.  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 Acute on chronic respiratory failure hypoxia plan is to continue with the wean goal is 4 hours on pressure support today patient right now has good tidal volumes noted on a pressure support of 12 PEEP 5 2. MSSA pneumonia treated clinically is improving we will continue supportive care 3. Delirium at baseline seems a little bit better today 4. Tracheostomy remains in place 5. Cerebellar infarction no change we will continue present management   I have personally seen and evaluated the patient, evaluated laboratory and imaging results, formulated the assessment and plan and placed orders. The Patient requires high complexity decision making with multiple systems involvement.  Rounds were done with the Respiratory Therapy Director and Staff therapists and discussed with nursing staff also.  12/22/19, MD [facility] Pulmonary Critical Care Medicine Sleep Medicine

## 2019-12-23 DIAGNOSIS — J9621 Acute and chronic respiratory failure with hypoxia: Secondary | ICD-10-CM | POA: Diagnosis not present

## 2019-12-23 DIAGNOSIS — J15211 Pneumonia due to Methicillin susceptible Staphylococcus aureus: Secondary | ICD-10-CM | POA: Diagnosis not present

## 2019-12-23 DIAGNOSIS — F05 Delirium due to known physiological condition: Secondary | ICD-10-CM | POA: Diagnosis not present

## 2019-12-23 DIAGNOSIS — I63549 Cerebral infarction due to unspecified occlusion or stenosis of unspecified cerebellar artery: Secondary | ICD-10-CM | POA: Diagnosis not present

## 2019-12-23 NOTE — Progress Notes (Signed)
Pulmonary Critical Care Medicine [facility] [place]   PULMONARY CRITICAL CARE SERVICE  PROGRESS NOTE  Date of Service: 12/23/2019  Melvin Howard  OMB:[id number]  DOB: May 10, 1946   DOA: 12-11-19  Referring Physician: Carron Curie, MD  HPI: Melvin Howard is a 74 y.o. male seen for follow up of Acute on Chronic Respiratory Failure.  Patient is weaning on pressure support on 928% FiO2 with a goal of 16 hours today  Medications: Reviewed on Rounds  Physical Exam:  Vitals: Temperature 97.5 pulse 75 respiratory 20 blood pressure 128/75 saturations 100%  Ventilator Settings mode ventilation pressure support FiO2 28% pressure 12 PEEP 5  . General: Comfortable at this time . Eyes: Grossly normal lids, irises & conjunctiva . ENT: grossly tongue is normal . Neck: no obvious mass . Cardiovascular: S1 S2 normal no gallop . Respiratory: No rhonchi no rales are noted at this time . Abdomen: soft . Skin: no rash seen on limited exam . Musculoskeletal: not rigid . Psychiatric:unable to assess . Neurologic: no seizure no involuntary movements         Lab Data:   Basic Metabolic Panel: Recent Labs  Lab 12/18/19 0532 12/20/19 0627 12/22/19 0550  NA 139 140 137  K 4.1 3.7 3.9  CL 100 104 100  CO2 29 26 25   GLUCOSE 117* 118* 116*  BUN 19 11 11   CREATININE 0.70 0.56* 0.63  CALCIUM 8.3* 8.4* 8.1*  MG 2.0 1.9 1.8  PHOS 3.8 3.8 3.7    ABG: No results for input(s): PHART, PCO2ART, PO2ART, HCO3, O2SAT in the last 168 hours.  Liver Function Tests: No results for input(s): AST, ALT, ALKPHOS, BILITOT, PROT, ALBUMIN in the last 168 hours. No results for input(s): LIPASE, AMYLASE in the last 168 hours. No results for input(s): AMMONIA in the last 168 hours.  CBC: Recent Labs  Lab 12/18/19 0532 12/20/19 0627 12/22/19 0550  WBC 10.4 9.4 8.4  HGB 10.5* 10.0* 10.7*  HCT 33.3* 32.2* 35.2*  MCV 94.1 95.8 95.4  PLT 377 353 368    Cardiac Enzymes: No  results for input(s): CKTOTAL, CKMB, CKMBINDEX, TROPONINI in the last 168 hours.  BNP (last 3 results) No results for input(s): BNP in the last 8760 hours.  ProBNP (last 3 results) No results for input(s): PROBNP in the last 8760 hours.  Radiological Exams: No results found.  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 Acute on chronic respiratory failure hypoxia plan is to continue with full support on pressure support mode 12/5 with a goal of 16 hours 2. MSSA pneumonia treated we will continue to follow 3. Delirium due to other drugs supportive care 4. Tracheostomy right now remains in place 5. Cerebellar infarction no change we will continue with present management   I have personally seen and evaluated the patient, evaluated laboratory and imaging results, formulated the assessment and plan and placed orders. The Patient requires high complexity decision making with multiple systems involvement.  Rounds were done with the Respiratory Therapy Director and Staff therapists and discussed with nursing staff also.  12/24/19, MD [facility] Pulmonary Critical Care Medicine Sleep Medicine

## 2019-12-24 DIAGNOSIS — I63549 Cerebral infarction due to unspecified occlusion or stenosis of unspecified cerebellar artery: Secondary | ICD-10-CM | POA: Diagnosis not present

## 2019-12-24 DIAGNOSIS — J9621 Acute and chronic respiratory failure with hypoxia: Secondary | ICD-10-CM | POA: Diagnosis not present

## 2019-12-24 DIAGNOSIS — F05 Delirium due to known physiological condition: Secondary | ICD-10-CM | POA: Diagnosis not present

## 2019-12-24 DIAGNOSIS — J15211 Pneumonia due to Methicillin susceptible Staphylococcus aureus: Secondary | ICD-10-CM | POA: Diagnosis not present

## 2019-12-24 NOTE — Progress Notes (Addendum)
Pulmonary Critical Care Medicine [facility] [place]   PULMONARY CRITICAL CARE SERVICE  PROGRESS NOTE  Date of Service: 12/24/2019  Melvin Howard  FUX:[id number]  DOB: 08-01-1946   DOA: 11/08/2019  Referring Physician: Carron Curie, MD  HPI: Melvin Howard is a 74 y.o. male seen for follow up of Acute on Chronic Respiratory Failure.  Patient is on pressure support mode the goal today is for 16 hours  Medications: Reviewed on Rounds  Physical Exam:  Vitals: Temperature 97.1 pulse 78 respiratory 24 blood pressure is 94/50 saturations 96%  Ventilator Settings mode ventilation pressure support FiO2 28% pressure 12 PEEP 5  . General: Comfortable at this time . Eyes: Grossly normal lids, irises & conjunctiva . ENT: grossly tongue is normal . Neck: no obvious mass . Cardiovascular: S1 S2 normal no gallop . Respiratory: No rhonchi no rales are noted at this time . Abdomen: soft . Skin: no rash seen on limited exam . Musculoskeletal: not rigid . Psychiatric:unable to assess . Neurologic: no seizure no involuntary movements         Lab Data:   Basic Metabolic Panel: Recent Labs  Lab 12/18/19 0532 12/20/19 0627 12/22/19 0550  NA 139 140 137  K 4.1 3.7 3.9  CL 100 104 100  CO2 29 26 25   GLUCOSE 117* 118* 116*  BUN 19 11 11   CREATININE 0.70 0.56* 0.63  CALCIUM 8.3* 8.4* 8.1*  MG 2.0 1.9 1.8  PHOS 3.8 3.8 3.7    ABG: No results for input(s): PHART, PCO2ART, PO2ART, HCO3, O2SAT in the last 168 hours.  Liver Function Tests: No results for input(s): AST, ALT, ALKPHOS, BILITOT, PROT, ALBUMIN in the last 168 hours. No results for input(s): LIPASE, AMYLASE in the last 168 hours. No results for input(s): AMMONIA in the last 168 hours.  CBC: Recent Labs  Lab 12/18/19 0532 12/20/19 0627 12/22/19 0550  WBC 10.4 9.4 8.4  HGB 10.5* 10.0* 10.7*  HCT 33.3* 32.2* 35.2*  MCV 94.1 95.8 95.4  PLT 377 353 368    Cardiac Enzymes: No results for  input(s): CKTOTAL, CKMB, CKMBINDEX, TROPONINI in the last 168 hours.  BNP (last 3 results) No results for input(s): BNP in the last 8760 hours.  ProBNP (last 3 results) No results for input(s): PROBNP in the last 8760 hours.  Radiological Exams: No results found.  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 Acute on chronic respiratory failure hypoxia plan is to continue with pressure support titrate oxygen continue pulmonary toilet 2. MSSA pneumonia treated we will continue to follow 3. Tracheostomy ( 4. Delirium no change we will continue to follow 5. Cerebral infarction no change supportive care   I have personally seen and evaluated the patient, evaluated laboratory and imaging results, formulated the assessment and plan and placed orders. The Patient requires high complexity decision making with multiple systems involvement.  Rounds were done with the Respiratory Therapy Director and Staff therapists and discussed with nursing staff also.  12/22/19, MD [facility] Pulmonary Critical Care Medicine Sleep Medicine

## 2019-12-25 DIAGNOSIS — J9621 Acute and chronic respiratory failure with hypoxia: Secondary | ICD-10-CM | POA: Diagnosis not present

## 2019-12-25 DIAGNOSIS — I63549 Cerebral infarction due to unspecified occlusion or stenosis of unspecified cerebellar artery: Secondary | ICD-10-CM | POA: Diagnosis not present

## 2019-12-25 DIAGNOSIS — F05 Delirium due to known physiological condition: Secondary | ICD-10-CM | POA: Diagnosis not present

## 2019-12-25 DIAGNOSIS — J15211 Pneumonia due to Methicillin susceptible Staphylococcus aureus: Secondary | ICD-10-CM | POA: Diagnosis not present

## 2019-12-25 LAB — BASIC METABOLIC PANEL
Anion gap: 8 (ref 5–15)
BUN: 18 mg/dL (ref 8–23)
CO2: 28 mmol/L (ref 22–32)
Calcium: 8 mg/dL — ABNORMAL LOW (ref 8.9–10.3)
Chloride: 102 mmol/L (ref 98–111)
Creatinine, Ser: 0.54 mg/dL — ABNORMAL LOW (ref 0.61–1.24)
GFR calc Af Amer: >60 mL/min (ref >60)
GFR calc non Af Amer: >60 mL/min (ref >60)
Glucose, Bld: 130 mg/dL — ABNORMAL HIGH (ref 70–99)
Potassium: 4.2 mmol/L (ref 3.5–5.1)
Sodium: 138 mmol/L (ref 135–145)

## 2019-12-25 LAB — CBC
HCT: 32.5 % — ABNORMAL LOW (ref 39.0–52.0)
Hemoglobin: 10 g/dL — ABNORMAL LOW (ref 13.0–17.0)
MCH: 29.1 pg (ref 26.0–34.0)
MCHC: 30.8 g/dL (ref 30.0–36.0)
MCV: 94.5 fL (ref 80.0–100.0)
Platelets: 299 10*3/uL (ref 150–400)
RBC: 3.44 MIL/uL — ABNORMAL LOW (ref 4.22–5.81)
RDW: 15.2 % (ref 11.5–15.5)
WBC: 7.7 10*3/uL (ref 4.0–10.5)
nRBC: 0 % (ref 0.0–0.2)

## 2019-12-25 LAB — MAGNESIUM: Magnesium: 1.9 mg/dL (ref 1.7–2.4)

## 2019-12-25 NOTE — Progress Notes (Addendum)
Pulmonary Critical Care Medicine [facility] [place]   PULMONARY CRITICAL CARE SERVICE  PROGRESS NOTE  Date of Service: 12/25/2019  Melvin Howard  [id number]:[id number]  DOB: [birth year]   DOA: 12-13-2019  Referring Physician: Carron Curie, MD  HPI: Melvin Howard is a 74 y.o. male seen for follow up of Acute on Chronic Respiratory Failure.  This morning patient did not do well as far as weaning is concerned so was placed back on the ventilator.  Right now is on assist control mode  Medications: Reviewed on Rounds  Physical Exam:  Vitals: Temperature is 98.2 pulse 82 respiratory 24 blood pressure is 116/63 saturations 93%  Ventilator Settings on assist control FiO2 28% tidal volume is 574 with a PEEP of 5  . General: Comfortable at this time . Eyes: Grossly normal lids, irises & conjunctiva . ENT: grossly tongue is normal . Neck: no obvious mass . Cardiovascular: S1 S2 normal no gallop . Respiratory: Coarse breath sounds few scattered rhonchi are noted . Abdomen: soft . Skin: no rash seen on limited exam . Musculoskeletal: not rigid . Psychiatric:unable to assess . Neurologic: no seizure no involuntary movements         Lab Data:   Basic Metabolic Panel: Recent Labs  Lab 12/20/19 0627 12/22/19 0550 12/25/19 0532  NA 140 137 138  K 3.7 3.9 4.2  CL 104 100 102  CO2 26 25 28   GLUCOSE 118* 116* 130*  BUN 11 11 18   CREATININE 0.56* 0.63 0.54*  CALCIUM 8.4* 8.1* 8.0*  MG 1.9 1.8 1.9  PHOS 3.8 3.7  --     ABG: No results for input(s): PHART, PCO2ART, PO2ART, HCO3, O2SAT in the last 168 hours.  Liver Function Tests: No results for input(s): AST, ALT, ALKPHOS, BILITOT, PROT, ALBUMIN in the last 168 hours. No results for input(s): LIPASE, AMYLASE in the last 168 hours. No results for input(s): AMMONIA in the last 168 hours.  CBC: Recent Labs  Lab 12/20/19 0627 12/22/19 0550 12/25/19 0532  WBC 9.4 8.4 7.7  HGB 10.0* 10.7* 10.0*  HCT 32.2*  35.2* 32.5*  MCV 95.8 95.4 94.5  PLT 353 368 299    Cardiac Enzymes: No results for input(s): CKTOTAL, CKMB, CKMBINDEX, TROPONINI in the last 168 hours.  BNP (last 3 results) No results for input(s): BNP in the last 8760 hours.  ProBNP (last 3 results) No results for input(s): PROBNP in the last 8760 hours.  Radiological Exams: No results found.  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 Acute on chronic respiratory failure with hypoxia patient is on full support on assist control currently on 28% FiO2 2. MSSA pneumonia treated we will continue with supportive care 3. Tracheostomy we will continue present management 4. Delirium no change 5. Cerebellar infarction no change continue with supportive care   I have personally seen and evaluated the patient, evaluated laboratory and imaging results, formulated the assessment and plan and placed orders. The Patient requires high complexity decision making with multiple systems involvement.  Rounds were done with the Respiratory Therapy Director and Staff therapists and discussed with nursing staff also.  12/24/19, MD [facility] Pulmonary Critical Care Medicine Sleep Medicine

## 2019-12-26 DIAGNOSIS — I63549 Cerebral infarction due to unspecified occlusion or stenosis of unspecified cerebellar artery: Secondary | ICD-10-CM | POA: Diagnosis not present

## 2019-12-26 DIAGNOSIS — F05 Delirium due to known physiological condition: Secondary | ICD-10-CM | POA: Diagnosis not present

## 2019-12-26 DIAGNOSIS — J15211 Pneumonia due to Methicillin susceptible Staphylococcus aureus: Secondary | ICD-10-CM | POA: Diagnosis not present

## 2019-12-26 DIAGNOSIS — J9621 Acute and chronic respiratory failure with hypoxia: Secondary | ICD-10-CM | POA: Diagnosis not present

## 2019-12-26 NOTE — Progress Notes (Signed)
Pulmonary Critical Care Medicine [facility] [place]   PULMONARY CRITICAL CARE SERVICE  PROGRESS NOTE  Date of Service: 12/26/2019  Melvin Howard  [id number]  DOB: 04/03/1946   DOA: December 23, 2019  Referring Physician: Carron Curie, MD  HPI: Melvin Howard is a 74 y.o. male seen for follow up of Acute on Chronic Respiratory Failure.  Patient currently is on full support on assist control mode has been on 28% FiO2 right now is on a PEEP of 5 without any distress  Medications: Reviewed on Rounds  Physical Exam:  Vitals: Temperature 98.0 pulse 65 respiratory 23 blood pressure 100/66 saturations 98%  Ventilator Settings on assist control FiO2 28% tidal line 348 PEEP 5  . General: Comfortable at this time . Eyes: Grossly normal lids, irises & conjunctiva . ENT: grossly tongue is normal . Neck: no obvious mass . Cardiovascular: S1 S2 normal no gallop . Respiratory: No rhonchi no rales are noted at this time . Abdomen: soft . Skin: no rash seen on limited exam . Musculoskeletal: not rigid . Psychiatric:unable to assess . Neurologic: no seizure no involuntary movements         Lab Data:   Basic Metabolic Panel: Recent Labs  Lab 12/20/19 0627 12/22/19 0550 12/25/19 0532  NA 140 137 138  K 3.7 3.9 4.2  CL 104 100 102  CO2 26 25 28   GLUCOSE 118* 116* 130*  BUN 11 11 18   CREATININE 0.56* 0.63 0.54*  CALCIUM 8.4* 8.1* 8.0*  MG 1.9 1.8 1.9  PHOS 3.8 3.7  --     ABG: No results for input(s): PHART, PCO2ART, PO2ART, HCO3, O2SAT in the last 168 hours.  Liver Function Tests: No results for input(s): AST, ALT, ALKPHOS, BILITOT, PROT, ALBUMIN in the last 168 hours. No results for input(s): LIPASE, AMYLASE in the last 168 hours. No results for input(s): AMMONIA in the last 168 hours.  CBC: Recent Labs  Lab 12/20/19 0627 12/22/19 0550 12/25/19 0532  WBC 9.4 8.4 7.7  HGB 10.0* 10.7* 10.0*  HCT 32.2* 35.2* 32.5*  MCV 95.8 95.4 94.5  PLT 353 368  299    Cardiac Enzymes: No results for input(s): CKTOTAL, CKMB, CKMBINDEX, TROPONINI in the last 168 hours.  BNP (last 3 results) No results for input(s): BNP in the last 8760 hours.  ProBNP (last 3 results) No results for input(s): PROBNP in the last 8760 hours.  Radiological Exams: No results found.  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 Acute on chronic respiratory failure hypoxia plan is to continue with full support on the ventilator patient's not been tolerating weaning today 2. MSSA pneumonia treated continue to follow 3. Delirium no change 4. Cerebellar infarction at baseline 5. Tracheostomy remains in place   I have personally seen and evaluated the patient, evaluated laboratory and imaging results, formulated the assessment and plan and placed orders. The Patient requires high complexity decision making with multiple systems involvement.  Rounds were done with the Respiratory Therapy Director and Staff therapists and discussed with nursing staff also.  12/24/19, MD [facility] Pulmonary Critical Care Medicine Sleep Medicine

## 2019-12-27 DIAGNOSIS — F05 Delirium due to known physiological condition: Secondary | ICD-10-CM | POA: Diagnosis not present

## 2019-12-27 DIAGNOSIS — I63549 Cerebral infarction due to unspecified occlusion or stenosis of unspecified cerebellar artery: Secondary | ICD-10-CM | POA: Diagnosis not present

## 2019-12-27 DIAGNOSIS — J15211 Pneumonia due to Methicillin susceptible Staphylococcus aureus: Secondary | ICD-10-CM | POA: Diagnosis not present

## 2019-12-27 DIAGNOSIS — J9621 Acute and chronic respiratory failure with hypoxia: Secondary | ICD-10-CM | POA: Diagnosis not present

## 2019-12-27 NOTE — Progress Notes (Addendum)
Pulmonary Critical Care Medicine [facility] [place]   PULMONARY CRITICAL CARE SERVICE  PROGRESS NOTE  Date of Service: 12/27/2019  Carol Loftin  FGH:[id number]  DOB: 10/24/1946   DOA: 2019/12/16  Referring Physician: Carron Curie, MD  HPI: Coner Gibbard is a 74 y.o. male seen for follow up of Acute on Chronic Respiratory Failure.  Patient was only able to tolerate 1 hour on pressure support today and is now back on full support currently no distress.  Medications: Reviewed on Rounds  Physical Exam:  Vitals: Pulse 76 respirations 28 BP 104/58 O2 sat 1% temp 98.8  Ventilator Settings ventilator mode AC VC rate of 15 tidal line 450 PEEP of 5 FiO2 28%  . General: Comfortable at this time . Eyes: Grossly normal lids, irises & conjunctiva . ENT: grossly tongue is normal . Neck: no obvious mass . Cardiovascular: S1 S2 normal no gallop . Respiratory: No rales or rhonchi noted . Abdomen: soft . Skin: no rash seen on limited exam . Musculoskeletal: not rigid . Psychiatric:unable to assess . Neurologic: no seizure no involuntary movements         Lab Data:   Basic Metabolic Panel: Recent Labs  Lab 12/22/19 0550 12/25/19 0532  NA 137 138  K 3.9 4.2  CL 100 102  CO2 25 28  GLUCOSE 116* 130*  BUN 11 18  CREATININE 0.63 0.54*  CALCIUM 8.1* 8.0*  MG 1.8 1.9  PHOS 3.7  --     ABG: No results for input(s): PHART, PCO2ART, PO2ART, HCO3, O2SAT in the last 168 hours.  Liver Function Tests: No results for input(s): AST, ALT, ALKPHOS, BILITOT, PROT, ALBUMIN in the last 168 hours. No results for input(s): LIPASE, AMYLASE in the last 168 hours. No results for input(s): AMMONIA in the last 168 hours.  CBC: Recent Labs  Lab 12/22/19 0550 12/25/19 0532  WBC 8.4 7.7  HGB 10.7* 10.0*  HCT 35.2* 32.5*  MCV 95.4 94.5  PLT 368 299    Cardiac Enzymes: No results for input(s): CKTOTAL, CKMB, CKMBINDEX, TROPONINI in the last 168 hours.  BNP (last 3  results) No results for input(s): BNP in the last 8760 hours.  ProBNP (last 3 results) No results for input(s): PROBNP in the last 8760 hours.  Radiological Exams: No results found.  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 Acute on chronic respiratory failure hypoxia we will continue weaning per protocol as patient can tolerate.  Unable to tolerate weaning today only for 1 hour on pressure support.  Resting on full support continue supportive measures and pulmonary toilet. 2. MSSA pneumonia treated continue to follow 3. Delirium no change 4. Cerebellar infarction at baseline 5. Tracheostomy remains in place   I have personally seen and evaluated the patient, evaluated laboratory and imaging results, formulated the assessment and plan and placed orders. The Patient requires high complexity decision making with multiple systems involvement.  Rounds were done with the Respiratory Therapy Director and Staff therapists and discussed with nursing staff also.  Yevonne Pax, MD [facility] Pulmonary Critical Care Medicine Sleep Medicine

## 2019-12-28 DIAGNOSIS — J15211 Pneumonia due to Methicillin susceptible Staphylococcus aureus: Secondary | ICD-10-CM | POA: Diagnosis not present

## 2019-12-28 DIAGNOSIS — F05 Delirium due to known physiological condition: Secondary | ICD-10-CM | POA: Diagnosis not present

## 2019-12-28 DIAGNOSIS — I63549 Cerebral infarction due to unspecified occlusion or stenosis of unspecified cerebellar artery: Secondary | ICD-10-CM | POA: Diagnosis not present

## 2019-12-28 DIAGNOSIS — J9621 Acute and chronic respiratory failure with hypoxia: Secondary | ICD-10-CM | POA: Diagnosis not present

## 2019-12-28 NOTE — Progress Notes (Addendum)
Pulmonary Critical Care Medicine [facility] [place]   PULMONARY CRITICAL CARE SERVICE  PROGRESS NOTE  Date of Service: 12/28/2019  Melvin Howard  [id number]  DOB: May 12, 1946   DOA: 11/06/2019  Referring Physician: Carron Curie, MD  HPI: Melvin Howard is a 74 y.o. male seen for follow up of Acute on Chronic Respiratory Failure.  Patient failed weaning today and remains vent dependent at this time assist-control mode with a rate of 15 FiO2 28%.  Satting well at this time.  Medications: Reviewed on Rounds  Physical Exam:  Vitals: Pulse 60 respirations 20 BP 127/70 O2 sat 99% temp 97.0  Ventilator Settings ventilator mode AC VC rate of 15 tidal 11/[birth year]/1948 PEEP 5 FiO2 28%  . General: Comfortable at this time . Eyes: Grossly normal lids, irises & conjunctiva . ENT: grossly tongue is normal . Neck: no obvious mass . Cardiovascular: S1 S2 normal no gallop . Respiratory: No rales or rhonchi noted . Abdomen: soft . Skin: no rash seen on limited exam . Musculoskeletal: not rigid . Psychiatric:unable to assess . Neurologic: no seizure no involuntary movements         Lab Data:   Basic Metabolic Panel: Recent Labs  Lab 12/22/19 0550 12/25/19 0532  NA 137 138  K 3.9 4.2  CL 100 102  CO2 25 28  GLUCOSE 116* 130*  BUN 11 18  CREATININE 0.63 0.54*  CALCIUM 8.1* 8.0*  MG 1.8 1.9  PHOS 3.7  --     ABG: No results for input(s): PHART, PCO2ART, PO2ART, HCO3, O2SAT in the last 168 hours.  Liver Function Tests: No results for input(s): AST, ALT, ALKPHOS, BILITOT, PROT, ALBUMIN in the last 168 hours. No results for input(s): LIPASE, AMYLASE in the last 168 hours. No results for input(s): AMMONIA in the last 168 hours.  CBC: Recent Labs  Lab 12/22/19 0550 12/25/19 0532  WBC 8.4 7.7  HGB 10.7* 10.0*  HCT 35.2* 32.5*  MCV 95.4 94.5  PLT 368 299    Cardiac Enzymes: No results for input(s): CKTOTAL, CKMB, CKMBINDEX, TROPONINI in the last 168  hours.  BNP (last 3 results) No results for input(s): BNP in the last 8760 hours.  ProBNP (last 3 results) No results for input(s): PROBNP in the last 8760 hours.  Radiological Exams: No results found.  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 Acute on chronic respiratory failure hypoxia  patient failed wean today and most likely will be ventilator dependent going forward.  Continue supportive measures and pulmonary toilet. 2. MSSA pneumonia treated continue to follow 3. Delirium no change 4. Cerebellar infarction at baseline 5. Tracheostomy remains in place    I have personally seen and evaluated the patient, evaluated laboratory and imaging results, formulated the assessment and plan and placed orders. The Patient requires high complexity decision making with multiple systems involvement.  Rounds were done with the Respiratory Therapy Director and Staff therapists and discussed with nursing staff also.  Yevonne Pax, MD [facility] Pulmonary Critical Care Medicine Sleep Medicine

## 2019-12-29 DIAGNOSIS — J9621 Acute and chronic respiratory failure with hypoxia: Secondary | ICD-10-CM | POA: Diagnosis not present

## 2019-12-29 DIAGNOSIS — J15211 Pneumonia due to Methicillin susceptible Staphylococcus aureus: Secondary | ICD-10-CM | POA: Diagnosis not present

## 2019-12-29 DIAGNOSIS — F05 Delirium due to known physiological condition: Secondary | ICD-10-CM | POA: Diagnosis not present

## 2019-12-29 DIAGNOSIS — I63549 Cerebral infarction due to unspecified occlusion or stenosis of unspecified cerebellar artery: Secondary | ICD-10-CM | POA: Diagnosis not present

## 2019-12-29 LAB — CBC
HCT: 32.3 % — ABNORMAL LOW (ref 39.0–52.0)
Hemoglobin: 10.2 g/dL — ABNORMAL LOW (ref 13.0–17.0)
MCH: 29.4 pg (ref 26.0–34.0)
MCHC: 31.6 g/dL (ref 30.0–36.0)
MCV: 93.1 fL (ref 80.0–100.0)
Platelets: 284 10*3/uL (ref 150–400)
RBC: 3.47 MIL/uL — ABNORMAL LOW (ref 4.22–5.81)
RDW: 15.2 % (ref 11.5–15.5)
WBC: 9.6 10*3/uL (ref 4.0–10.5)
nRBC: 0 % (ref 0.0–0.2)

## 2019-12-29 LAB — BASIC METABOLIC PANEL
Anion gap: 10 (ref 5–15)
BUN: 21 mg/dL (ref 8–23)
CO2: 29 mmol/L (ref 22–32)
Calcium: 8.3 mg/dL — ABNORMAL LOW (ref 8.9–10.3)
Chloride: 100 mmol/L (ref 98–111)
Creatinine, Ser: 0.7 mg/dL (ref 0.61–1.24)
GFR calc Af Amer: >60 mL/min (ref >60)
GFR calc non Af Amer: >60 mL/min (ref >60)
Glucose, Bld: 104 mg/dL — ABNORMAL HIGH (ref 70–99)
Potassium: 4 mmol/L (ref 3.5–5.1)
Sodium: 139 mmol/L (ref 135–145)

## 2019-12-29 LAB — MAGNESIUM: Magnesium: 1.8 mg/dL (ref 1.7–2.4)

## 2019-12-29 NOTE — Progress Notes (Signed)
Pulmonary Critical Care Medicine [facility] [place]   PULMONARY CRITICAL CARE SERVICE  PROGRESS NOTE  Date of Service: 12/29/2019  Melvin Howard  ATF:[id number]  DOB: January 11, 1946   DOA: 12-12-2019  Referring Physician: Carron Curie, MD  HPI: Melvin Howard is a 74 y.o. male seen for follow up of Acute on Chronic Respiratory Failure.  Patient is on pressure support mode currently on 28% FiO2 with a goal of 16 hours  Medications: Reviewed on Rounds  Physical Exam:  Vitals: Temperature 98.1 pulse 85 respiratory 23 blood pressure is 130/76 saturations 99%  Ventilator Settings on pressure support FiO2 28% pressure12 PEEP 5  . General: Comfortable at this time . Eyes: Grossly normal lids, irises & conjunctiva . ENT: grossly tongue is normal . Neck: no obvious mass . Cardiovascular: S1 S2 normal no gallop . Respiratory: Coarse breath sounds with few scattered rhonchi . Abdomen: soft . Skin: no rash seen on limited exam . Musculoskeletal: not rigid . Psychiatric:unable to assess . Neurologic: no seizure no involuntary movements         Lab Data:   Basic Metabolic Panel: Recent Labs  Lab 12/25/19 0532 12/29/19 0556  NA 138 139  K 4.2 4.0  CL 102 100  CO2 28 29  GLUCOSE 130* 104*  BUN 18 21  CREATININE 0.54* 0.70  CALCIUM 8.0* 8.3*  MG 1.9 1.8    ABG: No results for input(s): PHART, PCO2ART, PO2ART, HCO3, O2SAT in the last 168 hours.  Liver Function Tests: No results for input(s): AST, ALT, ALKPHOS, BILITOT, PROT, ALBUMIN in the last 168 hours. No results for input(s): LIPASE, AMYLASE in the last 168 hours. No results for input(s): AMMONIA in the last 168 hours.  CBC: Recent Labs  Lab 12/25/19 0532 12/29/19 0556  WBC 7.7 9.6  HGB 10.0* 10.2*  HCT 32.5* 32.3*  MCV 94.5 93.1  PLT 299 284    Cardiac Enzymes: No results for input(s): CKTOTAL, CKMB, CKMBINDEX, TROPONINI in the last 168 hours.  BNP (last 3 results) No results for  input(s): BNP in the last 8760 hours.  ProBNP (last 3 results) No results for input(s): PROBNP in the last 8760 hours.  Radiological Exams: No results found.  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 Acute on chronic respiratory failure hypoxia plan is to continue with full support on the ventilator goal is 16-hour wean pressure support 2. MSSA pneumonia treated we will continue with supportive care 3. Delirium no change 4. Tracheostomy remains in place 5. Cerebellar infarction patient is unchanged essentially from the acute stroke   I have personally seen and evaluated the patient, evaluated laboratory and imaging results, formulated the assessment and plan and placed orders. The Patient requires high complexity decision making with multiple systems involvement.  Rounds were done with the Respiratory Therapy Director and Staff therapists and discussed with nursing staff also.  Yevonne Pax, MD [facility] Pulmonary Critical Care Medicine Sleep Medicine

## 2019-12-30 ENCOUNTER — Other Ambulatory Visit (HOSPITAL_COMMUNITY): Payer: Medicare Other

## 2019-12-30 DIAGNOSIS — J15211 Pneumonia due to Methicillin susceptible Staphylococcus aureus: Secondary | ICD-10-CM | POA: Diagnosis not present

## 2019-12-30 DIAGNOSIS — J9621 Acute and chronic respiratory failure with hypoxia: Secondary | ICD-10-CM | POA: Diagnosis not present

## 2019-12-30 DIAGNOSIS — I63549 Cerebral infarction due to unspecified occlusion or stenosis of unspecified cerebellar artery: Secondary | ICD-10-CM | POA: Diagnosis not present

## 2019-12-30 DIAGNOSIS — F05 Delirium due to known physiological condition: Secondary | ICD-10-CM | POA: Diagnosis not present

## 2019-12-30 NOTE — Progress Notes (Signed)
Pulmonary Critical Care Medicine [facility] [place]   PULMONARY CRITICAL CARE SERVICE  PROGRESS NOTE  Date of Service: 12/30/2019  Melvin Howard  [id number]  DOB: 01-Sep-1946   DOA: 2019/12/06  Referring Physician: Carron Curie, MD  HPI: Melvin Howard is a 74 y.o. male seen for follow up of Acute on Chronic Respiratory Failure.  Patient this time is on full support on the ventilator.  Should be reassessed for weaning  Medications: Reviewed on Rounds  Physical Exam:  Vitals: Temperature 98.1 pulse 70 respiratory rate 16 blood pressure is 124/70 saturations 97%  Ventilator Settings on assist control FiO2 28% tidal volume 481 PEEP 5  . General: Comfortable at this time . Eyes: Grossly normal lids, irises & conjunctiva . ENT: grossly tongue is normal . Neck: no obvious mass . Cardiovascular: S1 S2 normal no gallop . Respiratory: No rhonchi no rales are noted at this time . Abdomen: soft . Skin: no rash seen on limited exam . Musculoskeletal: not rigid . Psychiatric:unable to assess . Neurologic: no seizure no involuntary movements         Lab Data:   Basic Metabolic Panel: Recent Labs  Lab 12/25/19 0532 12/29/19 0556  NA 138 139  K 4.2 4.0  CL 102 100  CO2 28 29  GLUCOSE 130* 104*  BUN 18 21  CREATININE 0.54* 0.70  CALCIUM 8.0* 8.3*  MG 1.9 1.8    ABG: No results for input(s): PHART, PCO2ART, PO2ART, HCO3, O2SAT in the last 168 hours.  Liver Function Tests: No results for input(s): AST, ALT, ALKPHOS, BILITOT, PROT, ALBUMIN in the last 168 hours. No results for input(s): LIPASE, AMYLASE in the last 168 hours. No results for input(s): AMMONIA in the last 168 hours.  CBC: Recent Labs  Lab 12/25/19 0532 12/29/19 0556  WBC 7.7 9.6  HGB 10.0* 10.2*  HCT 32.5* 32.3*  MCV 94.5 93.1  PLT 299 284    Cardiac Enzymes: No results for input(s): CKTOTAL, CKMB, CKMBINDEX, TROPONINI in the last 168 hours.  BNP (last 3 results) No  results for input(s): BNP in the last 8760 hours.  ProBNP (last 3 results) No results for input(s): PROBNP in the last 8760 hours.  Radiological Exams: No results found.  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 Acute on chronic respiratory failure hypoxia plan is to continue with T collar trials as tolerated right now had not been started respiratory therapy will reassess 2. MSSA pneumonia treated improving 3. Tracheostomy remains in place 4. Delirium no change 5. Cerebellar infarction no change   I have personally seen and evaluated the patient, evaluated laboratory and imaging results, formulated the assessment and plan and placed orders. The Patient requires high complexity decision making with multiple systems involvement.  Rounds were done with the Respiratory Therapy Director and Staff therapists and discussed with nursing staff also.  Yevonne Pax, MD [facility] Pulmonary Critical Care Medicine Sleep Medicine

## 2019-12-31 DIAGNOSIS — F05 Delirium due to known physiological condition: Secondary | ICD-10-CM | POA: Diagnosis not present

## 2019-12-31 DIAGNOSIS — J9621 Acute and chronic respiratory failure with hypoxia: Secondary | ICD-10-CM | POA: Diagnosis not present

## 2019-12-31 DIAGNOSIS — J15211 Pneumonia due to Methicillin susceptible Staphylococcus aureus: Secondary | ICD-10-CM | POA: Diagnosis not present

## 2019-12-31 DIAGNOSIS — I63549 Cerebral infarction due to unspecified occlusion or stenosis of unspecified cerebellar artery: Secondary | ICD-10-CM | POA: Diagnosis not present

## 2019-12-31 NOTE — Progress Notes (Signed)
Pulmonary Critical Care Medicine [facility] [place]   PULMONARY CRITICAL CARE SERVICE  PROGRESS NOTE  Date of Service: 12/31/2019  Melvin Howard  TDD:[id number]  DOB: Oct 17, 1946   DOA: 11/21/2019  Referring Physician: Carron Curie, MD  HPI: Melvin Howard is a 74 y.o. male seen for follow up of Acute on Chronic Respiratory Failure.  Patient now is on pressure support on 20% FiO2 for goal of 16 hours today  Medications: Reviewed on Rounds  Physical Exam:  Vitals: Temperature is 97.3 pulse 67 respiratory rate 24 blood pressure 115/63 saturations 99%  Ventilator Settings mode ventilation pressure support FiO2 28% pressure 12 PEEP 5 tidal volume 352 cc  . General: Comfortable at this time . Eyes: Grossly normal lids, irises & conjunctiva . ENT: grossly tongue is normal . Neck: no obvious mass . Cardiovascular: S1 S2 normal no gallop . Respiratory: Coarse breath sounds with few scattered rhonchi . Abdomen: soft . Skin: no rash seen on limited exam . Musculoskeletal: not rigid . Psychiatric:unable to assess . Neurologic: no seizure no involuntary movements         Lab Data:   Basic Metabolic Panel: Recent Labs  Lab 12/25/19 0532 12/29/19 0556  NA 138 139  K 4.2 4.0  CL 102 100  CO2 28 29  GLUCOSE 130* 104*  BUN 18 21  CREATININE 0.54* 0.70  CALCIUM 8.0* 8.3*  MG 1.9 1.8    ABG: No results for input(s): PHART, PCO2ART, PO2ART, HCO3, O2SAT in the last 168 hours.  Liver Function Tests: No results for input(s): AST, ALT, ALKPHOS, BILITOT, PROT, ALBUMIN in the last 168 hours. No results for input(s): LIPASE, AMYLASE in the last 168 hours. No results for input(s): AMMONIA in the last 168 hours.  CBC: Recent Labs  Lab 12/25/19 0532 12/29/19 0556  WBC 7.7 9.6  HGB 10.0* 10.2*  HCT 32.5* 32.3*  MCV 94.5 93.1  PLT 299 284    Cardiac Enzymes: No results for input(s): CKTOTAL, CKMB, CKMBINDEX, TROPONINI in the last 168 hours.  BNP (last  3 results) No results for input(s): BNP in the last 8760 hours.  ProBNP (last 3 results) No results for input(s): PROBNP in the last 8760 hours.  Radiological Exams: DG CHEST PORT 1 VIEW  Result Date: 12/30/2019 CLINICAL DATA:  History of MRSA pneumonia. EXAM: PORTABLE CHEST 1 VIEW COMPARISON:  12/20/2019 FINDINGS: Patient rotated left. Tracheostomy. Borderline cardiomegaly. Left hemidiaphragm elevation. Possible small left pleural effusion. No pneumothorax. Improvement in left base airspace disease. Hazy increased density projecting over the right lung diffusely. IMPRESSION: Possible small left pleural effusion with improvement in adjacent airspace disease. Vague increased density projecting over the right chest diffusely. Possibly technique related. Asymmetric interstitial edema could look similar. Electronically Signed   By: Jeronimo Greaves M.D.   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 Acute on chronic respiratory failure hypoxia plan is to continue plan to wean for 16-hour goal on pressure support 2. MSSA pneumonia treated we will continue present management 3. Tracheostomy remains in place 4. Delirium no change 5. Cerebellar infarction no change continue with supportive care   I have personally seen and evaluated the patient, evaluated laboratory and imaging results, formulated the assessment and  plan and placed orders. The Patient requires high complexity decision making with multiple systems involvement.  Rounds were done with the Respiratory Therapy Director and Staff therapists and discussed with nursing staff also.  Allyne Gee, MD [facility] Pulmonary Critical Care Medicine Sleep  Medicine

## 2020-01-01 DIAGNOSIS — F05 Delirium due to known physiological condition: Secondary | ICD-10-CM | POA: Diagnosis not present

## 2020-01-01 DIAGNOSIS — J15211 Pneumonia due to Methicillin susceptible Staphylococcus aureus: Secondary | ICD-10-CM | POA: Diagnosis not present

## 2020-01-01 DIAGNOSIS — J9621 Acute and chronic respiratory failure with hypoxia: Secondary | ICD-10-CM | POA: Diagnosis not present

## 2020-01-01 DIAGNOSIS — I63549 Cerebral infarction due to unspecified occlusion or stenosis of unspecified cerebellar artery: Secondary | ICD-10-CM | POA: Diagnosis not present

## 2020-01-01 LAB — CBC
HCT: 35 % — ABNORMAL LOW (ref 39.0–52.0)
Hemoglobin: 10.6 g/dL — ABNORMAL LOW (ref 13.0–17.0)
MCH: 28.6 pg (ref 26.0–34.0)
MCHC: 30.3 g/dL (ref 30.0–36.0)
MCV: 94.3 fL (ref 80.0–100.0)
Platelets: 300 10*3/uL (ref 150–400)
RBC: 3.71 MIL/uL — ABNORMAL LOW (ref 4.22–5.81)
RDW: 15.5 % (ref 11.5–15.5)
WBC: 12.2 10*3/uL — ABNORMAL HIGH (ref 4.0–10.5)
nRBC: 0 % (ref 0.0–0.2)

## 2020-01-01 LAB — BASIC METABOLIC PANEL
Anion gap: 13 (ref 5–15)
BUN: 27 mg/dL — ABNORMAL HIGH (ref 8–23)
CO2: 30 mmol/L (ref 22–32)
Calcium: 8.7 mg/dL — ABNORMAL LOW (ref 8.9–10.3)
Chloride: 98 mmol/L (ref 98–111)
Creatinine, Ser: 0.48 mg/dL — ABNORMAL LOW (ref 0.61–1.24)
GFR calc Af Amer: >60 mL/min (ref >60)
GFR calc non Af Amer: >60 mL/min (ref >60)
Glucose, Bld: 135 mg/dL — ABNORMAL HIGH (ref 70–99)
Potassium: 3.9 mmol/L (ref 3.5–5.1)
Sodium: 141 mmol/L (ref 135–145)

## 2020-01-01 LAB — PHOSPHORUS: Phosphorus: 2.9 mg/dL (ref 2.5–4.6)

## 2020-01-01 LAB — MAGNESIUM: Magnesium: 1.9 mg/dL (ref 1.7–2.4)

## 2020-01-01 NOTE — Progress Notes (Signed)
Pulmonary Critical Care Medicine [facility] [place]   PULMONARY CRITICAL CARE SERVICE  PROGRESS NOTE  Date of Service: 01/01/2020  Melvin Howard  WGN:[id number]  DOB: Jan 09, 1946   DOA: 11/19/2019  Referring Physician: Carron Curie, MD  HPI: Melvin Howard is a 74 y.o. male seen for follow up of Acute on Chronic Respiratory Failure.  Patient remains on pressure support mode on 28% FiO2 yesterday was able to complete 9 hours  Medications: Reviewed on Rounds  Physical Exam:  Vitals: Temperature 97.2 pulse 72 respiratory 17 blood pressure is 111/67 saturations 100%  Ventilator Settings on pressure support FiO2 28% pressure 12 PEEP 5  . General: Comfortable at this time . Eyes: Grossly normal lids, irises & conjunctiva . ENT: grossly tongue is normal . Neck: no obvious mass . Cardiovascular: S1 S2 normal no gallop . Respiratory: Coarse breath sounds few rhonchi . Abdomen: soft . Skin: no rash seen on limited exam . Musculoskeletal: not rigid . Psychiatric:unable to assess . Neurologic: no seizure no involuntary movements         Lab Data:   Basic Metabolic Panel: Recent Labs  Lab 12/29/19 0556 01/01/20 0520  NA 139 141  K 4.0 3.9  CL 100 98  CO2 29 30  GLUCOSE 104* 135*  BUN 21 27*  CREATININE 0.70 0.48*  CALCIUM 8.3* 8.7*  MG 1.8 1.9  PHOS  --  2.9    ABG: No results for input(s): PHART, PCO2ART, PO2ART, HCO3, O2SAT in the last 168 hours.  Liver Function Tests: No results for input(s): AST, ALT, ALKPHOS, BILITOT, PROT, ALBUMIN in the last 168 hours. No results for input(s): LIPASE, AMYLASE in the last 168 hours. No results for input(s): AMMONIA in the last 168 hours.  CBC: Recent Labs  Lab 12/29/19 0556 01/01/20 0520  WBC 9.6 12.2*  HGB 10.2* 10.6*  HCT 32.3* 35.0*  MCV 93.1 94.3  PLT 284 300    Cardiac Enzymes: No results for input(s): CKTOTAL, CKMB, CKMBINDEX, TROPONINI in the last 168 hours.  BNP (last 3 results) No  results for input(s): BNP in the last 8760 hours.  ProBNP (last 3 results) No results for input(s): PROBNP in the last 8760 hours.  Radiological Exams: DG CHEST PORT 1 VIEW  Result Date: 12/30/2019 CLINICAL DATA:  History of MRSA pneumonia. EXAM: PORTABLE CHEST 1 VIEW COMPARISON:  12/20/2019 FINDINGS: Patient rotated left. Tracheostomy. Borderline cardiomegaly. Left hemidiaphragm elevation. Possible small left pleural effusion. No pneumothorax. Improvement in left base airspace disease. Hazy increased density projecting over the right lung diffusely. IMPRESSION: Possible small left pleural effusion with improvement in adjacent airspace disease. Vague increased density projecting over the right chest diffusely. Possibly technique related. Asymmetric interstitial edema could look similar. Electronically Signed   By: Jeronimo Greaves M.D.   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 Acute on chronic respiratory failure hypoxia plan is to continue with pressure support titrate oxygen as necessary the goal is for 12 hours today 2. MSSA pneumonia treated clinically improving some interstitial edema noted on the last chest film and a small pleural effusion 3. Delirium no change 4. Tracheostomy remains in place 5. Cerebellar infarction no change   I have personally seen and evaluated  the patient, evaluated laboratory and imaging results, formulated the assessment and plan and placed orders. The Patient requires high complexity decision making with multiple systems involvement.  Rounds were done with the Respiratory Therapy Director and Staff therapists and discussed with nursing staff also.  Allyne Gee,  MD [facility] Pulmonary Critical Care Medicine Sleep Medicine

## 2020-01-02 DIAGNOSIS — F05 Delirium due to known physiological condition: Secondary | ICD-10-CM | POA: Diagnosis not present

## 2020-01-02 DIAGNOSIS — I63549 Cerebral infarction due to unspecified occlusion or stenosis of unspecified cerebellar artery: Secondary | ICD-10-CM | POA: Diagnosis not present

## 2020-01-02 DIAGNOSIS — J9621 Acute and chronic respiratory failure with hypoxia: Secondary | ICD-10-CM | POA: Diagnosis not present

## 2020-01-02 DIAGNOSIS — J15211 Pneumonia due to Methicillin susceptible Staphylococcus aureus: Secondary | ICD-10-CM | POA: Diagnosis not present

## 2020-01-02 NOTE — Progress Notes (Signed)
Pulmonary Critical Care Medicine [facility] [place]   PULMONARY CRITICAL CARE SERVICE  PROGRESS NOTE  Date of Service: 01/02/2020  Melvin Howard  VPX:[id number]  DOB: September 17, 1946   DOA: 10/31/2019  Referring Physician: Carron Curie, MD  HPI: Melvin Howard is a 74 y.o. male seen for follow up of Acute on Chronic Respiratory Failure.  Patient currently is on assist control mode on 28% FiO2 good saturations are noted  Medications: Reviewed on Rounds  Physical Exam:  Vitals: Temperature 95.8 pulse 64 respiratory 24 blood pressure is 109/68 saturations 98%  Ventilator Settings on assist control FiO2 20% tidal volume 387 PEEP 5  . General: Comfortable at this time . Eyes: Grossly normal lids, irises & conjunctiva . ENT: grossly tongue is normal . Neck: no obvious mass . Cardiovascular: S1 S2 normal no gallop . Respiratory: Scattered rhonchi expansion is equal . Abdomen: soft . Skin: no rash seen on limited exam . Musculoskeletal: not rigid . Psychiatric:unable to assess . Neurologic: no seizure no involuntary movements         Lab Data:   Basic Metabolic Panel: Recent Labs  Lab 12/29/19 0556 01/01/20 0520  NA 139 141  K 4.0 3.9  CL 100 98  CO2 29 30  GLUCOSE 104* 135*  BUN 21 27*  CREATININE 0.70 0.48*  CALCIUM 8.3* 8.7*  MG 1.8 1.9  PHOS  --  2.9    ABG: No results for input(s): PHART, PCO2ART, PO2ART, HCO3, O2SAT in the last 168 hours.  Liver Function Tests: No results for input(s): AST, ALT, ALKPHOS, BILITOT, PROT, ALBUMIN in the last 168 hours. No results for input(s): LIPASE, AMYLASE in the last 168 hours. No results for input(s): AMMONIA in the last 168 hours.  CBC: Recent Labs  Lab 12/29/19 0556 01/01/20 0520  WBC 9.6 12.2*  HGB 10.2* 10.6*  HCT 32.3* 35.0*  MCV 93.1 94.3  PLT 284 300    Cardiac Enzymes: No results for input(s): CKTOTAL, CKMB, CKMBINDEX, TROPONINI in the last 168 hours.  BNP (last 3 results) No  results for input(s): BNP in the last 8760 hours.  ProBNP (last 3 results) No results for input(s): PROBNP in the last 8760 hours.  Radiological Exams: No results found.  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 Acute on chronic respiratory failure with hypoxia plan is to continue with full support on the vent patient's mechanics have been poor not tolerating weaning 2. MSSA pneumonia treated we will continue to follow 3. Tracheostomy remains in place 4. Delirium no change 5. Cerebellar infarction at baseline we will continue with present management   I have personally seen and evaluated the patient, evaluated laboratory and imaging results, formulated the assessment and plan and placed orders. The Patient requires high complexity decision making with multiple systems involvement.  Rounds were done with the Respiratory Therapy Director and Staff therapists and discussed with nursing staff also.  Yevonne Pax, MD [facility] Pulmonary Critical Care Medicine Sleep Medicine

## 2020-01-03 ENCOUNTER — Other Ambulatory Visit (HOSPITAL_COMMUNITY): Payer: Medicare Other

## 2020-01-03 DIAGNOSIS — I63549 Cerebral infarction due to unspecified occlusion or stenosis of unspecified cerebellar artery: Secondary | ICD-10-CM | POA: Diagnosis not present

## 2020-01-03 DIAGNOSIS — F05 Delirium due to known physiological condition: Secondary | ICD-10-CM | POA: Diagnosis not present

## 2020-01-03 DIAGNOSIS — J9621 Acute and chronic respiratory failure with hypoxia: Secondary | ICD-10-CM | POA: Diagnosis not present

## 2020-01-03 DIAGNOSIS — J15211 Pneumonia due to Methicillin susceptible Staphylococcus aureus: Secondary | ICD-10-CM | POA: Diagnosis not present

## 2020-01-03 NOTE — Progress Notes (Addendum)
Pulmonary Critical Care Medicine [facility] [place]   PULMONARY CRITICAL CARE SERVICE  PROGRESS NOTE  Date of Service: 01/03/2020  Melvin Howard  OVF:[id number]  DOB: Jan 10, 1946   DOA: 11/19/2019  Referring Physician: Carron Curie, MD  HPI: Melvin Howard is a 74 y.o. male seen for follow up of Acute on Chronic Respiratory Failure.  Patient failed weaning today remains on assist control mode rate 15 with FiO2 of 28%.  Medications: Reviewed on Rounds  Physical Exam:  Vitals: Pulse 75 respirations 24 BP 129/59 O2 sat 96% temp 97.6  Ventilator Settings ventilator mode AC VC rate of 15.450 PEEP of 5 FiO2 28%  . General: Comfortable at this time . Eyes: Grossly normal lids, irises & conjunctiva . ENT: grossly tongue is normal . Neck: no obvious mass . Cardiovascular: S1 S2 normal no gallop . Respiratory: No rales or rhonchi noted . Abdomen: soft . Skin: no rash seen on limited exam . Musculoskeletal: not rigid . Psychiatric:unable to assess . Neurologic: no seizure no involuntary movements         Lab Data:   Basic Metabolic Panel: Recent Labs  Lab 12/29/19 0556 01/01/20 0520  NA 139 141  K 4.0 3.9  CL 100 98  CO2 29 30  GLUCOSE 104* 135*  BUN 21 27*  CREATININE 0.70 0.48*  CALCIUM 8.3* 8.7*  MG 1.8 1.9  PHOS  --  2.9    ABG: No results for input(s): PHART, PCO2ART, PO2ART, HCO3, O2SAT in the last 168 hours.  Liver Function Tests: No results for input(s): AST, ALT, ALKPHOS, BILITOT, PROT, ALBUMIN in the last 168 hours. No results for input(s): LIPASE, AMYLASE in the last 168 hours. No results for input(s): AMMONIA in the last 168 hours.  CBC: Recent Labs  Lab 12/29/19 0556 01/01/20 0520  WBC 9.6 12.2*  HGB 10.2* 10.6*  HCT 32.3* 35.0*  MCV 93.1 94.3  PLT 284 300    Cardiac Enzymes: No results for input(s): CKTOTAL, CKMB, CKMBINDEX, TROPONINI in the last 168 hours.  BNP (last 3 results) No results for input(s): BNP in the  last 8760 hours.  ProBNP (last 3 results) No results for input(s): PROBNP in the last 8760 hours.  Radiological Exams: DG CHEST PORT 1 VIEW  Result Date: 01/03/2020 CLINICAL DATA:  Shortness of breath and pneumonia follow-up EXAM: PORTABLE CHEST 1 VIEW COMPARISON:  12/30/2019 prior exams FINDINGS: Tracheostomy tube and LEFT basilar atelectasis/opacity again noted. This is a low volume film. No pneumothorax or definite pleural effusion. There has been little interval change since the prior study. IMPRESSION: Unchanged appearance of the chest with LEFT basilar atelectasis/opacity. Electronically Signed   By: Harmon Pier M.D.   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 Acute on chronic respiratory failure with hypoxia plan is to continue with full support on the vent patient's mechanics have been poor not tolerating weaning 2. MSSA pneumonia treated we will continue to follow 3. Tracheostomy remains in place 4. Delirium no change 5. Cerebellar infarction at baseline we will continue with present management   I have personally seen and evaluated the patient, evaluated laboratory and imaging results, formulated the assessment and plan and placed orders. The Patient requires high complexity  decision making with multiple systems involvement.  Rounds were done with the Respiratory Therapy Director and Staff therapists and discussed with nursing staff also.  Allyne Gee, MD [facility] Pulmonary Critical Care Medicine Sleep Medicine

## 2020-01-04 DIAGNOSIS — J9621 Acute and chronic respiratory failure with hypoxia: Secondary | ICD-10-CM | POA: Diagnosis not present

## 2020-01-04 DIAGNOSIS — I63549 Cerebral infarction due to unspecified occlusion or stenosis of unspecified cerebellar artery: Secondary | ICD-10-CM | POA: Diagnosis not present

## 2020-01-04 DIAGNOSIS — F05 Delirium due to known physiological condition: Secondary | ICD-10-CM | POA: Diagnosis not present

## 2020-01-04 DIAGNOSIS — J15211 Pneumonia due to Methicillin susceptible Staphylococcus aureus: Secondary | ICD-10-CM | POA: Diagnosis not present

## 2020-01-04 LAB — URINALYSIS, ROUTINE W REFLEX MICROSCOPIC
Bilirubin Urine: NEGATIVE
Glucose, UA: NEGATIVE mg/dL
Ketones, ur: 5 mg/dL — AB
Nitrite: NEGATIVE
Protein, ur: >=300 mg/dL — AB
RBC / HPF: >50 RBC/hpf — ABNORMAL HIGH (ref 0–5)
Specific Gravity, Urine: 1.031 — ABNORMAL HIGH (ref 1.005–1.030)
WBC, UA: >50 WBC/hpf — ABNORMAL HIGH (ref 0–5)
pH: 6 (ref 5.0–8.0)

## 2020-01-04 NOTE — Progress Notes (Addendum)
Pulmonary Critical Care Medicine [facility] [place]   PULMONARY CRITICAL CARE SERVICE  PROGRESS NOTE  Date of Service: 01/04/2020  Melvin Howard  FAO:[id number]  DOB: 18-Aug-1946   DOA: 11/08/2019  Referring Physician: Carron Curie, MD  HPI: Melvin Howard is a 74 y.o. male seen for follow up of Acute on Chronic Respiratory Failure.  Patient is on pressure support this time for an 8-hour goal currently satting well no distress.  Medications: Reviewed on Rounds  Physical Exam:  Vitals: Pulse 66 respirations 17 BP 124/61 O2 sat 98% temp 97.6  Ventilator Settings pressure support 14/5 FiO2 28%  . General: Comfortable at this time . Eyes: Grossly normal lids, irises & conjunctiva . ENT: grossly tongue is normal . Neck: no obvious mass . Cardiovascular: S1 S2 normal no gallop . Respiratory: No rales or rhonchi noted . Abdomen: soft . Skin: no rash seen on limited exam . Musculoskeletal: not rigid . Psychiatric:unable to assess . Neurologic: no seizure no involuntary movements         Lab Data:   Basic Metabolic Panel: Recent Labs  Lab 12/29/19 0556 01/01/20 0520  NA 139 141  K 4.0 3.9  CL 100 98  CO2 29 30  GLUCOSE 104* 135*  BUN 21 27*  CREATININE 0.70 0.48*  CALCIUM 8.3* 8.7*  MG 1.8 1.9  PHOS  --  2.9    ABG: No results for input(s): PHART, PCO2ART, PO2ART, HCO3, O2SAT in the last 168 hours.  Liver Function Tests: No results for input(s): AST, ALT, ALKPHOS, BILITOT, PROT, ALBUMIN in the last 168 hours. No results for input(s): LIPASE, AMYLASE in the last 168 hours. No results for input(s): AMMONIA in the last 168 hours.  CBC: Recent Labs  Lab 12/29/19 0556 01/01/20 0520  WBC 9.6 12.2*  HGB 10.2* 10.6*  HCT 32.3* 35.0*  MCV 93.1 94.3  PLT 284 300    Cardiac Enzymes: No results for input(s): CKTOTAL, CKMB, CKMBINDEX, TROPONINI in the last 168 hours.  BNP (last 3 results) No results for input(s): BNP in the last 8760  hours.  ProBNP (last 3 results) No results for input(s): PROBNP in the last 8760 hours.  Radiological Exams: DG CHEST PORT 1 VIEW  Result Date: 01/03/2020 CLINICAL DATA:  Shortness of breath and pneumonia follow-up EXAM: PORTABLE CHEST 1 VIEW COMPARISON:  12/30/2019 prior exams FINDINGS: Tracheostomy tube and LEFT basilar atelectasis/opacity again noted. This is a low volume film. No pneumothorax or definite pleural effusion. There has been little interval change since the prior study. IMPRESSION: Unchanged appearance of the chest with LEFT basilar atelectasis/opacity. Electronically Signed   By: Harmon Pier M.D.   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 Acute on chronic respiratory failure with hypoxia plan is to continue to wean patient currently has an 8-hour goal today on pressure support.  Continue aggressive pulmonary toilet supportive measures. 2. MSSA pneumonia treated we will continue to follow 3. Tracheostomy remains in place 4. Delirium no change 5. Cerebellar infarction at baseline we will continue with present management   I have personally seen and evaluated the patient, evaluated laboratory and imaging results, formulated the assessment and plan and placed orders. The Patient requires high complexity decision  making with multiple systems involvement.  Rounds were done with the Respiratory Therapy Director and Staff therapists and discussed with nursing staff also.  Allyne Gee, MD [facility] Pulmonary Critical Care Medicine Sleep Medicine

## 2020-01-05 DIAGNOSIS — J9621 Acute and chronic respiratory failure with hypoxia: Secondary | ICD-10-CM | POA: Diagnosis not present

## 2020-01-05 DIAGNOSIS — I63549 Cerebral infarction due to unspecified occlusion or stenosis of unspecified cerebellar artery: Secondary | ICD-10-CM | POA: Diagnosis not present

## 2020-01-05 DIAGNOSIS — J15211 Pneumonia due to Methicillin susceptible Staphylococcus aureus: Secondary | ICD-10-CM | POA: Diagnosis not present

## 2020-01-05 DIAGNOSIS — F05 Delirium due to known physiological condition: Secondary | ICD-10-CM | POA: Diagnosis not present

## 2020-01-05 LAB — BLOOD GAS, ARTERIAL
Acid-Base Excess: 7.3 mmol/L — ABNORMAL HIGH (ref 0.0–2.0)
Bicarbonate: 31.7 mmol/L — ABNORMAL HIGH (ref 20.0–28.0)
FIO2: 35
O2 Saturation: 93.7 %
Patient temperature: 37
pCO2 arterial: 48 mmHg (ref 32.0–48.0)
pH, Arterial: 7.436 (ref 7.350–7.450)
pO2, Arterial: 70 mmHg — ABNORMAL LOW (ref 83.0–108.0)

## 2020-01-05 LAB — BASIC METABOLIC PANEL
Anion gap: 12 (ref 5–15)
BUN: 21 mg/dL (ref 8–23)
CO2: 27 mmol/L (ref 22–32)
Calcium: 8.3 mg/dL — ABNORMAL LOW (ref 8.9–10.3)
Chloride: 100 mmol/L (ref 98–111)
Creatinine, Ser: 0.56 mg/dL — ABNORMAL LOW (ref 0.61–1.24)
GFR calc Af Amer: >60 mL/min (ref >60)
GFR calc non Af Amer: >60 mL/min (ref >60)
Glucose, Bld: 142 mg/dL — ABNORMAL HIGH (ref 70–99)
Potassium: 3.9 mmol/L (ref 3.5–5.1)
Sodium: 139 mmol/L (ref 135–145)

## 2020-01-05 LAB — CBC
HCT: 36.8 % — ABNORMAL LOW (ref 39.0–52.0)
Hemoglobin: 11.4 g/dL — ABNORMAL LOW (ref 13.0–17.0)
MCH: 29 pg (ref 26.0–34.0)
MCHC: 31 g/dL (ref 30.0–36.0)
MCV: 93.6 fL (ref 80.0–100.0)
Platelets: 353 10*3/uL (ref 150–400)
RBC: 3.93 MIL/uL — ABNORMAL LOW (ref 4.22–5.81)
RDW: 16.3 % — ABNORMAL HIGH (ref 11.5–15.5)
WBC: 10.3 10*3/uL (ref 4.0–10.5)
nRBC: 0 % (ref 0.0–0.2)

## 2020-01-05 LAB — MAGNESIUM: Magnesium: 1.9 mg/dL (ref 1.7–2.4)

## 2020-01-05 LAB — PHOSPHORUS: Phosphorus: 3.9 mg/dL (ref 2.5–4.6)

## 2020-01-05 NOTE — Progress Notes (Signed)
Pulmonary Critical Care Medicine [facility] [place]   PULMONARY CRITICAL CARE SERVICE  PROGRESS NOTE  Date of Service: 01/05/2020  Melvin Howard  OIB:[id number]  DOB: [birth year]   DOA: 12-05-19  Referring Physician: Carron Curie, MD  HPI: Melvin Howard is a 74 y.o. male seen for follow up of Acute on Chronic Respiratory Failure.  Patient currently is on pressure support mode on 28% FiO2 12/5  Medications: Reviewed on Rounds  Physical Exam:  Vitals: Temperature is 99.3 pulse 70 respiratory 18 blood pressure is 121/64 saturations 93%  Ventilator Settings on pressure support 12/5  . General: Comfortable at this time . Eyes: Grossly normal lids, irises & conjunctiva . ENT: grossly tongue is normal . Neck: no obvious mass . Cardiovascular: S1 S2 normal no gallop . Respiratory: Scattered rhonchi coarse breath sounds . Abdomen: soft . Skin: no rash seen on limited exam . Musculoskeletal: not rigid . Psychiatric:unable to assess . Neurologic: no seizure no involuntary movements         Lab Data:   Basic Metabolic Panel: Recent Labs  Lab 01/01/20 0520 01/05/20 0450  NA 141 139  K 3.9 3.9  CL 98 100  CO2 30 27  GLUCOSE 135* 142*  BUN 27* 21  CREATININE 0.48* 0.56*  CALCIUM 8.7* 8.3*  MG 1.9 1.9  PHOS 2.9 3.9    ABG: No results for input(s): PHART, PCO2ART, PO2ART, HCO3, O2SAT in the last 168 hours.  Liver Function Tests: No results for input(s): AST, ALT, ALKPHOS, BILITOT, PROT, ALBUMIN in the last 168 hours. No results for input(s): LIPASE, AMYLASE in the last 168 hours. No results for input(s): AMMONIA in the last 168 hours.  CBC: Recent Labs  Lab 01/01/20 0520 01/05/20 0450  WBC 12.2* 10.3  HGB 10.6* 11.4*  HCT 35.0* 36.8*  MCV 94.3 93.6  PLT 300 353    Cardiac Enzymes: No results for input(s): CKTOTAL, CKMB, CKMBINDEX, TROPONINI in the last 168 hours.  BNP (last 3 results) No results for input(s): BNP in the last 8760  hours.  ProBNP (last 3 results) No results for input(s): PROBNP in the last 8760 hours.  Radiological Exams: No results found.  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 Acute on chronic respiratory failure with hypoxia continue with the pressure support mode will titrate oxygen as tolerated try the patient on T collar if able to tolerate 2. MSSA pneumonia treated we will continue to follow 3. Delirium no change 4. Tracheostomy remains in place 5. Cerebellar infarction no change   I have personally seen and evaluated the patient, evaluated laboratory and imaging results, formulated the assessment and plan and placed orders. The Patient requires high complexity decision making with multiple systems involvement.  Rounds were done with the Respiratory Therapy Director and Staff therapists and discussed with nursing staff also.  Yevonne Pax, MD [facility] Pulmonary Critical Care Medicine Sleep Medicine

## 2020-01-06 ENCOUNTER — Other Ambulatory Visit (HOSPITAL_COMMUNITY): Payer: Medicare Other

## 2020-01-06 DIAGNOSIS — J15211 Pneumonia due to Methicillin susceptible Staphylococcus aureus: Secondary | ICD-10-CM | POA: Diagnosis not present

## 2020-01-06 DIAGNOSIS — I63549 Cerebral infarction due to unspecified occlusion or stenosis of unspecified cerebellar artery: Secondary | ICD-10-CM | POA: Diagnosis not present

## 2020-01-06 DIAGNOSIS — F05 Delirium due to known physiological condition: Secondary | ICD-10-CM | POA: Diagnosis not present

## 2020-01-06 DIAGNOSIS — J9621 Acute and chronic respiratory failure with hypoxia: Secondary | ICD-10-CM | POA: Diagnosis not present

## 2020-01-06 LAB — URINE CULTURE: Culture: >=100000 — AB

## 2020-01-06 LAB — BLOOD GAS, ARTERIAL
Acid-Base Excess: 8.6 mmol/L — ABNORMAL HIGH (ref 0.0–2.0)
Bicarbonate: 32.3 mmol/L — ABNORMAL HIGH (ref 20.0–28.0)
FIO2: 28
O2 Saturation: 97 %
Patient temperature: 37
pCO2 arterial: 41.2 mmHg (ref 32.0–48.0)
pH, Arterial: 7.505 — ABNORMAL HIGH (ref 7.350–7.450)
pO2, Arterial: 81.5 mmHg — ABNORMAL LOW (ref 83.0–108.0)

## 2020-01-06 NOTE — Progress Notes (Signed)
Pulmonary Critical Care Medicine [facility] [place]   PULMONARY CRITICAL CARE SERVICE  PROGRESS NOTE  Date of Service: 01/06/2020  Hezzie Karim  TZG:[id number]  DOB: 1946/05/24   DOA: 12-11-2019  Referring Physician: Carron Curie, MD  HPI: Jonmichael Beadnell is a 74 y.o. male seen for follow up of Acute on Chronic Respiratory Failure.  Patient was attempted on T collar however did not do well and was placed back on the ventilator right now is on assist control mode on 28% FiO2  Medications: Reviewed on Rounds  Physical Exam:  Vitals: Temperature 97.7 pulse 88 respiratory rate 25 blood pressure is 123/77 saturations 94%  Ventilator Settings on assist control FiO2 28% tidal volume 470 PEEP 5  . General: Comfortable at this time . Eyes: Grossly normal lids, irises & conjunctiva . ENT: grossly tongue is normal . Neck: no obvious mass . Cardiovascular: S1 S2 normal no gallop . Respiratory: Scattered rhonchi expansion is equal . Abdomen: soft . Skin: no rash seen on limited exam . Musculoskeletal: not rigid . Psychiatric:unable to assess . Neurologic: no seizure no involuntary movements         Lab Data:   Basic Metabolic Panel: Recent Labs  Lab 01/01/20 0520 01/05/20 0450  NA 141 139  K 3.9 3.9  CL 98 100  CO2 30 27  GLUCOSE 135* 142*  BUN 27* 21  CREATININE 0.48* 0.56*  CALCIUM 8.7* 8.3*  MG 1.9 1.9  PHOS 2.9 3.9    ABG: Recent Labs  Lab 01/05/20 1750 01/06/20 0618  PHART 7.436 7.505*  PCO2ART 48.0 41.2  PO2ART 70.0* 81.5*  HCO3 31.7* 32.3*  O2SAT 93.7 97.0    Liver Function Tests: No results for input(s): AST, ALT, ALKPHOS, BILITOT, PROT, ALBUMIN in the last 168 hours. No results for input(s): LIPASE, AMYLASE in the last 168 hours. No results for input(s): AMMONIA in the last 168 hours.  CBC: Recent Labs  Lab 01/01/20 0520 01/05/20 0450  WBC 12.2* 10.3  HGB 10.6* 11.4*  HCT 35.0* 36.8*  MCV 94.3 93.6  PLT 300 353     Cardiac Enzymes: No results for input(s): CKTOTAL, CKMB, CKMBINDEX, TROPONINI in the last 168 hours.  BNP (last 3 results) No results for input(s): BNP in the last 8760 hours.  ProBNP (last 3 results) No results for input(s): PROBNP in the last 8760 hours.  Radiological Exams: DG CHEST PORT 1 VIEW  Result Date: 01/06/2020 CLINICAL DATA:  Fever and aspiration EXAM: PORTABLE CHEST 1 VIEW COMPARISON:  01/02/2019 FINDINGS: Tracheostomy tube tip is above the carina. Normal heart size. No pleural effusion or edema. Decreased lung volumes. Left basilar opacity is again noted which may represent atelectasis or pneumonia. Remaining portions of lungs are clear. IMPRESSION: Persistent left basilar opacity which may represent atelectasis or pneumonia. Electronically Signed   By: Signa Kell M.D.   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 Acute on chronic respiratory failure with hypoxia plan is to continue with full support for now reassess weaning potential again in the morning 2. MSSA pneumonia treated clinically is improving 3. Delirium no change 4. Tracheostomy remains in place 5. Cerebellar infarction no change   I have personally seen and evaluated the  patient, evaluated laboratory and imaging results, formulated the assessment and plan and placed orders. The Patient requires high complexity decision making with multiple systems involvement.  Rounds were done with the Respiratory Therapy Director and Staff therapists and discussed with nursing staff also.  Allyne Gee, MD [facility] Pulmonary Critical Care Medicine Sleep Medicine

## 2020-01-07 DIAGNOSIS — I63549 Cerebral infarction due to unspecified occlusion or stenosis of unspecified cerebellar artery: Secondary | ICD-10-CM | POA: Diagnosis not present

## 2020-01-07 DIAGNOSIS — J15211 Pneumonia due to Methicillin susceptible Staphylococcus aureus: Secondary | ICD-10-CM | POA: Diagnosis not present

## 2020-01-07 DIAGNOSIS — J9621 Acute and chronic respiratory failure with hypoxia: Secondary | ICD-10-CM | POA: Diagnosis not present

## 2020-01-07 DIAGNOSIS — F05 Delirium due to known physiological condition: Secondary | ICD-10-CM | POA: Diagnosis not present

## 2020-01-07 LAB — CULTURE, RESPIRATORY W GRAM STAIN

## 2020-01-07 NOTE — Progress Notes (Addendum)
Pulmonary Critical Care Medicine [place]   PULMONARY CRITICAL CARE SERVICE  PROGRESS NOTE  Date of Service: 01/07/2020  Melvin Howard  JOA:[id number]  DOB: 14-Sep-1946   DOA: December 19, 2019  Referring Physician: Merton Border, MD  HPI: Melvin Howard is a 74 y.o. male seen for follow up of Acute on Chronic Respiratory Failure.  Patient weaned for 4 hours and 45 minutes today on aerosol trach collar 35% FiO2 is now back on full support on ventilator satting well at this time.  Medications: Reviewed on Rounds  Physical Exam:  Vitals: Pulse 82 respirations 23 BP 143/72 O2 sat 98% temp 98.8  Ventilator Settings ventilator mode AC VC rate of 15.450 PEEP of 5 FiO2 28%  . General: Comfortable at this time . Eyes: Grossly normal lids, irises & conjunctiva . ENT: grossly tongue is normal . Neck: no obvious mass . Cardiovascular: S1 S2 normal no gallop . Respiratory: No rales or rhonchi noted . Abdomen: soft . Skin: no rash seen on limited exam . Musculoskeletal: not rigid . Psychiatric:unable to assess . Neurologic: no seizure no involuntary movements         Lab Data:   Basic Metabolic Panel: Recent Labs  Lab 01/01/20 0520 01/05/20 0450  NA 141 139  K 3.9 3.9  CL 98 100  CO2 30 27  GLUCOSE 135* 142*  BUN 27* 21  CREATININE 0.48* 0.56*  CALCIUM 8.7* 8.3*  MG 1.9 1.9  PHOS 2.9 3.9    ABG: Recent Labs  Lab 01/05/20 1750 01/06/20 0618  PHART 7.436 7.505*  PCO2ART 48.0 41.2  PO2ART 70.0* 81.5*  HCO3 31.7* 32.3*  O2SAT 93.7 97.0    Liver Function Tests: No results for input(s): AST, ALT, ALKPHOS, BILITOT, PROT, ALBUMIN in the last 168 hours. No results for input(s): LIPASE, AMYLASE in the last 168 hours. No results for input(s): AMMONIA in the last 168 hours.  CBC: Recent Labs  Lab 01/01/20 0520 01/05/20 0450  WBC 12.2* 10.3  HGB 10.6* 11.4*  HCT 35.0* 36.8*  MCV 94.3 93.6  PLT 300 353    Cardiac Enzymes: No results for  input(s): CKTOTAL, CKMB, CKMBINDEX, TROPONINI in the last 168 hours.  BNP (last 3 results) No results for input(s): BNP in the last 8760 hours.  ProBNP (last 3 results) No results for input(s): PROBNP in the last 8760 hours.  Radiological Exams: DG CHEST PORT 1 VIEW  Result Date: 01/06/2020 CLINICAL DATA:  Fever and aspiration EXAM: PORTABLE CHEST 1 VIEW COMPARISON:  01/02/2019 FINDINGS: Tracheostomy tube tip is above the carina. Normal heart size. No pleural effusion or edema. Decreased lung volumes. Left basilar opacity is again noted which may represent atelectasis or pneumonia. Remaining portions of lungs are clear. IMPRESSION: Persistent left basilar opacity which may represent atelectasis or pneumonia. Electronically Signed   By: Kerby Moors M.D.   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 Acute on chronic respiratory failure with hypoxia plan is to continue to wean patient on aerosol trach collar managed 4 hours and 45 minutes today.  Continue to wean per protocol continue supportive measures and pulmonary toilet. 2. MSSA pneumonia treated clinically is improving 3. Delirium no change 4. Tracheostomy remains in place  5. Cerebellar infarction no change   I have personally seen and evaluated the patient, evaluated laboratory and imaging results, formulated the assessment and plan and placed orders. The Patient requires high complexity decision making with multiple systems involvement.  Rounds were done with the Respiratory Therapy Director and Staff therapists and discussed with nursing staff also.  Yevonne Pax, MD [facility] Pulmonary Critical Care Medicine Sleep Medicine

## 2020-01-08 DIAGNOSIS — J15211 Pneumonia due to Methicillin susceptible Staphylococcus aureus: Secondary | ICD-10-CM | POA: Diagnosis not present

## 2020-01-08 DIAGNOSIS — F05 Delirium due to known physiological condition: Secondary | ICD-10-CM | POA: Diagnosis not present

## 2020-01-08 DIAGNOSIS — I63549 Cerebral infarction due to unspecified occlusion or stenosis of unspecified cerebellar artery: Secondary | ICD-10-CM | POA: Diagnosis not present

## 2020-01-08 DIAGNOSIS — J9621 Acute and chronic respiratory failure with hypoxia: Secondary | ICD-10-CM | POA: Diagnosis not present

## 2020-01-08 LAB — BASIC METABOLIC PANEL
Anion gap: 14 (ref 5–15)
BUN: 34 mg/dL — ABNORMAL HIGH (ref 8–23)
CO2: 28 mmol/L (ref 22–32)
Calcium: 8.7 mg/dL — ABNORMAL LOW (ref 8.9–10.3)
Chloride: 98 mmol/L (ref 98–111)
Creatinine, Ser: 0.57 mg/dL — ABNORMAL LOW (ref 0.61–1.24)
GFR calc Af Amer: >60 mL/min (ref >60)
GFR calc non Af Amer: >60 mL/min (ref >60)
Glucose, Bld: 149 mg/dL — ABNORMAL HIGH (ref 70–99)
Potassium: 4 mmol/L (ref 3.5–5.1)
Sodium: 140 mmol/L (ref 135–145)

## 2020-01-08 LAB — CBC
HCT: 36.5 % — ABNORMAL LOW (ref 39.0–52.0)
Hemoglobin: 11.3 g/dL — ABNORMAL LOW (ref 13.0–17.0)
MCH: 29.2 pg (ref 26.0–34.0)
MCHC: 31 g/dL (ref 30.0–36.0)
MCV: 94.3 fL (ref 80.0–100.0)
Platelets: 386 10*3/uL (ref 150–400)
RBC: 3.87 MIL/uL — ABNORMAL LOW (ref 4.22–5.81)
RDW: 15.7 % — ABNORMAL HIGH (ref 11.5–15.5)
WBC: 10.1 10*3/uL (ref 4.0–10.5)
nRBC: 0 % (ref 0.0–0.2)

## 2020-01-08 LAB — MAGNESIUM: Magnesium: 2 mg/dL (ref 1.7–2.4)

## 2020-01-08 NOTE — Consult Note (Signed)
Infectious Disease Consultation   Karthikeya Funke  JOA:[id number]  DOB: 20-Sep-1946  DOA: 11/21/2019  Requesting physician: Dr.Brown  Reason for consultation: Antibiotic recommendations   History of Present Illness: Aristeo Hankerson is an 74 y.o. male with history of stroke, cerebellar infarction, pneumonia who initially presented to [facility] with projectile vomiting, severe headache, dysphagia, dysarthria.  He was noted to have ataxia.  Head CT showed large left cerebellar infarction.  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  He was admitted to the neurosurgical ICU and was started on aspirin.  Hospital course complicated by respiratory failure and he had to be intubated.  He had tracheostomy done on 11/03/2019.  He did not tolerate weaning trials.  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  Biopsy showed benign mucosa.  His hospital course was also complicated by MSSA pneumonia and tracheobronchitis.  Chest x-ray at the outside facility showed opacification with cultures that showed MSSA.  He also had acute renal failure, hyponatremia.  He had episodes of delirium, agitation, questionable dementia.  Due to his complex medical problems he was transferred and [facility].  He has had recurrent pneumonia with MSSA.  He has received several rounds of antibiotic treatment including with IV vancomycin, nafcillin, Ancef, Unasyn.  Blood cultures have been negative.  Recent respiratory cultures from 01/04/2020 again showing MSSA.  Per primary team his trach has been changed recently.  Chest x-ray from yesterday showing persistent left basilar opacity likely atelectasis versus pneumonia.  Urine culture showing E. coli.  He is currently on 28% FiO2, PEEP of 5.  He is unresponsive, does not follow any commands.   Review of Systems:  Unable to obtain review of systems at  this time.   Past Medical History: Past Medical History:  Diagnosis Date  . Acute on chronic respiratory failure with hypoxia (HCC)   . 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 Delirium due to another medical condition   . MSSA (methicillin susceptible Staphylococcus aureus) pneumonia (HCC)   . Tracheostomy status (HCC)   Hyperlipidemia Acute stroke involving the cerebellum  Past Surgical History: Tracheostomy, PEG tube placement  Allergies: Doxycycline  Social History: Unable to obtain at this time  Family History: Unable to obtain at this time   Physical Exam: Temperature 100, pulse 77, respiratory rate 30, blood pressure 135/76, pulse oximetry 95% on 28% FiO2, PEEP of 5 General: Ill-appearing male, encephalopathy, not following any commands at this time Head/eyes: Atraumatic, normocephalic, PERLA ENMT: external ears and nose appear normal Neck: Has trach in place CVS: S1-S2 clear, no murmur   Respiratory: Rhonchi, no wheezing Abdomen: soft, nondistended, has abdominal binder in place, normal bowel sounds  Musculoskeletal: No edema or contractures Neuro: Patient encephalopathy, not following any commands at this time.  Unable to do a neurologic exam. Psych: Unable to assess Skin: no rashes  Data reviewed:  I have personally reviewed following labs and imaging studies Labs:  CBC: Recent Labs  Lab 01/05/20 0450 01/08/20 0519  WBC 10.3 10.1  HGB 11.4* 11.3*  HCT 36.8* 36.5*  MCV 93.6 94.3  PLT 353 386    Basic Metabolic Panel: Recent Labs  Lab 01/05/20 0450 01/08/20 0519  NA 139 140  K 3.9 4.0  CL 100 98  CO2 27 28  GLUCOSE 142*  149*  BUN 21 34*  CREATININE 0.56* 0.57*  CALCIUM 8.3* 8.7*  MG 1.9 2.0  PHOS 3.9  --    GFR CrCl cannot be calculated (Unknown ideal weight.). Liver Function Tests: No results for input(s): AST, ALT, ALKPHOS, BILITOT, PROT, ALBUMIN in the last 168 hours. No results for input(s):  LIPASE, AMYLASE in the last 168 hours. No results for input(s): AMMONIA in the last 168 hours. Coagulation profile No results for input(s): INR, PROTIME in the last 168 hours.  Cardiac Enzymes: No results for input(s): CKTOTAL, CKMB, CKMBINDEX, TROPONINI in the last 168 hours. BNP: Invalid input(s): POCBNP CBG: No results for input(s): GLUCAP in the last 168 hours. D-Dimer No results for input(s): DDIMER in the last 72 hours. Hgb A1c No results for input(s): HGBA1C in the last 72 hours. Lipid Profile No results for input(s): CHOL, HDL, LDLCALC, TRIG, CHOLHDL, LDLDIRECT in the last 72 hours. Thyroid function studies No results for input(s): TSH, T4TOTAL, T3FREE, THYROIDAB in the last 72 hours.  Invalid input(s): FREET3 Anemia work up No results for input(s): VITAMINB12, FOLATE, FERRITIN, TIBC, IRON, RETICCTPCT in the last 72 hours. Urinalysis    Component Value Date/Time   COLORURINE AMBER (A) 01/04/2020 1900   APPEARANCEUR CLOUDY (A) 01/04/2020 1900   LABSPEC 1.031 (H) 01/04/2020 1900   PHURINE 6.0 01/04/2020 1900   GLUCOSEU NEGATIVE 01/04/2020 1900   HGBUR SMALL (A) 01/04/2020 1900   BILIRUBINUR NEGATIVE 01/04/2020 1900   KETONESUR 5 (A) 01/04/2020 1900   PROTEINUR >=300 (A) 01/04/2020 1900   NITRITE NEGATIVE 01/04/2020 1900   LEUKOCYTESUR MODERATE (A) 01/04/2020 1900     Microbiology Recent Results (from the past 240 hour(s))  Culture, Urine     Status: Abnormal   Collection Time: 01/04/20  5:17 PM   Specimen: Urine, Random  Result Value Ref Range Status   Specimen Description URINE, RANDOM  Final   Special Requests   Final    NONE Performed at [facility], [address] [place], [place] [address]    Culture >=100,000 COLONIES/mL ESCHERICHIA COLI (A)  Final   Report Status 01/06/2020 FINAL  Final   Organism ID, Bacteria ESCHERICHIA COLI (A)  Final      Susceptibility   Escherichia coli - MIC*    AMPICILLIN 4 SENSITIVE Sensitive     CEFAZOLIN <=4  SENSITIVE Sensitive     CEFTRIAXONE <=0.25 SENSITIVE Sensitive     CIPROFLOXACIN <=0.25 SENSITIVE Sensitive     GENTAMICIN <=1 SENSITIVE Sensitive     IMIPENEM <=0.25 SENSITIVE Sensitive     NITROFURANTOIN <=16 SENSITIVE Sensitive     TRIMETH/SULFA <=20 SENSITIVE Sensitive     AMPICILLIN/SULBACTAM <=2 SENSITIVE Sensitive     PIP/TAZO <=4 SENSITIVE Sensitive     * >=100,000 COLONIES/mL ESCHERICHIA COLI  Culture, respiratory (non-expectorated)     Status: None   Collection Time: 01/04/20  6:32 PM   Specimen: Tracheal Aspirate; Respiratory  Result Value Ref Range Status   Specimen Description TRACHEAL ASPIRATE  Final   Special Requests NONE  Final   Gram Stain   Final    ABUNDANT WBC PRESENT, PREDOMINANTLY PMN MODERATE GRAM POSITIVE COCCI IN PAIRS IN CLUSTERS FEW GRAM NEGATIVE RODS RARE GRAM NEGATIVE COCCOBACILLI Performed at [facility], [address], [place], [place] [address]    Culture MODERATE STAPHYLOCOCCUS AUREUS  Final   Report Status 01/07/2020 FINAL  Final   Organism ID, Bacteria STAPHYLOCOCCUS AUREUS  Final      Susceptibility  Staphylococcus aureus - MIC*    CIPROFLOXACIN <=0.5 SENSITIVE Sensitive     ERYTHROMYCIN RESISTANT Resistant     GENTAMICIN <=0.5 SENSITIVE Sensitive     OXACILLIN <=0.25 SENSITIVE Sensitive     TETRACYCLINE <=1 SENSITIVE Sensitive     VANCOMYCIN 1 SENSITIVE Sensitive     TRIMETH/SULFA <=10 SENSITIVE Sensitive     CLINDAMYCIN RESISTANT Resistant     RIFAMPIN <=0.5 SENSITIVE Sensitive     Inducible Clindamycin POSITIVE Resistant     * MODERATE STAPHYLOCOCCUS AUREUS  Culture, blood (routine x 2)     Status: None (Preliminary result)   Collection Time: 01/04/20  6:52 PM   Specimen: BLOOD RIGHT HAND  Result Value Ref Range Status   Specimen Description BLOOD RIGHT HAND  Final   Special Requests   Final    BOTTLES DRAWN AEROBIC ONLY Blood Culture results may not be optimal due to an inadequate volume of blood received in culture  bottles   Culture   Final    NO GROWTH 4 DAYS Performed at [facility], [place] [address], [place], [place] [address]    Report Status PENDING  Incomplete  Culture, blood (routine x 2)     Status: None (Preliminary result)   Collection Time: 01/04/20  7:05 PM   Specimen: BLOOD LEFT HAND  Result Value Ref Range Status   Specimen Description BLOOD LEFT HAND  Final   Special Requests   Final    BOTTLES DRAWN AEROBIC AND ANAEROBIC Blood Culture adequate volume   Culture   Final    NO GROWTH 4 DAYS Performed at [facility], [place] [address], [place], [place] [address]    Report Status PENDING  Incomplete       Inpatient Medications:   Scheduled Meds: Continuous Infusions:   Radiological Exams on Admission: No results found.  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 Coli Recurrent pneumonia Systemic inflammatory response syndrome Acute kidney injury Dysphagia Protein calorie malnutrition Left palatine tonsil mass  Acute on chronic hypoxemic respiratory failure: Multifactorial.  He has had recurrent pneumonia with MSSA.  He has received several rounds of antibiotic treatment.  He received treatment with IV vancomycin, nafcillin, Ancef, Unasyn.  Recent respiratory cultures again showing MSSA. Colonization versus actual infection?  However, he is having low-grade fevers. At this time will attempt treatment with ciprofloxacin.  We will plan to treat for duration of 10-14 days pending improvement.  Per the primary team his trach has been recently changed.  He also has dysphagia given the recent stroke and high suspicion for ongoing aspiration  which likely places him at a very high risk for worsening pneumonia, worsening respiratory failure despite being on antibiotics.  Respiratory status does not improve would recommend to obtain chest CT to better evaluate. Pulmonary following.  Pneumonia secondary to MSSA: As mentioned above he has had pneumonia tracheobronchitis and respiratory cultures showed MSSA.  Colonization versus pneumonia?  He is having low-grade fevers.  He was already treated with IV vancomycin, nafcillin, Ancef, Unasyn.  Most recent sputum cultures again showing MSSA.  Per the primary team his trach has been recently changed.  He was started on IV cefepime.  Will recommend to discontinue cefepime and  switch him to ciprofloxacin.  Will plan to treat for duration of 10-14 days pending improvement.  As mentioned above, there is also high concern for ongoing aspiration which places him at a high risk for recurrent and worsening pneumonia despite being on antibiotics.  Systemic inflammatory response syndrome: Likely secondary to UTI/pneumonia.  Urine cultures showing E. coli and sputum culture showing MSSA.  He is currently on cefepime.  We will switch to ciprofloxacin based on the sensitivities.  Continue to monitor.  He is at high risk for worsening sepsis.  UTI: As mentioned above urine culture showed E. coli.  Antibiotics and plan as mentioned above. Dysphagia: Due to his dysphagia he is high risk for aspiration and worsening respiratory failure secondary to aspiration pneumonia.  Left posterior/inferior cerebellar artery infarction: Involving the cerebellum and medulla secondary to left vertebral artery dissection.  On aspirin and statins.  Further management per primary team.  Protein calorie malnutrition: Management per primary team.  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  He will need ENT follow-up once his acute issues resolved.  Acute kidney injury: Continue to  monitor BUN/trending closely especially while on antibiotics.  Other management per primary team.  Avoid nephrotoxic medications.  Delirium/agitation: Questionable dementia with encephalopathy.  Continue medications and management per primary team.  Unfortunately due to his complex medical problems he is very high risk for worsening and decompensation.  Plan of care discussed with the primary team.  Thank you for this consultation.    Vonzella Nipple M.D. 01/08/2020, 6:10 PM

## 2020-01-08 NOTE — Progress Notes (Addendum)
Pulmonary Critical Care Medicine [facility] [place]   PULMONARY CRITICAL CARE SERVICE  PROGRESS NOTE  Date of Service: 01/08/2020  Alcario Tinkey  OIN:[id number]  DOB: Sep 23, 1946   DOA: Dec 21, 2019  Referring Physician: Carron Curie, MD  HPI: Melvin Howard is a 74 y.o. male seen for follow up of Acute on Chronic Respiratory Failure.  Patient remains on full support on assist control mode was supposed to do T collar for 5 hours today  Medications: Reviewed on Rounds  Physical Exam:  Vitals: Temperature 97.4 pulse 67 respiratory 29 blood pressure is 135/76 saturations 98%  Ventilator Settings assist control FiO2 28% tidal volume 471 PEEP 5  . General: Comfortable at this time . Eyes: Grossly normal lids, irises & conjunctiva . ENT: grossly tongue is normal . Neck: no obvious mass . Cardiovascular: S1 S2 normal no gallop . Respiratory: Scattered rhonchi expansion is equal . Abdomen: soft . Skin: no rash seen on limited exam . Musculoskeletal: not rigid . Psychiatric:unable to assess . Neurologic: no seizure no involuntary movements         Lab Data:   Basic Metabolic Panel: Recent Labs  Lab 01/05/20 0450 01/08/20 0519  NA 139 140  K 3.9 4.0  CL 100 98  CO2 27 28  GLUCOSE 142* 149*  BUN 21 34*  CREATININE 0.56* 0.57*  CALCIUM 8.3* 8.7*  MG 1.9 2.0  PHOS 3.9  --     ABG: Recent Labs  Lab 01/05/20 1750 01/06/20 0618  PHART 7.436 7.505*  PCO2ART 48.0 41.2  PO2ART 70.0* 81.5*  HCO3 31.7* 32.3*  O2SAT 93.7 97.0    Liver Function Tests: No results for input(s): AST, ALT, ALKPHOS, BILITOT, PROT, ALBUMIN in the last 168 hours. No results for input(s): LIPASE, AMYLASE in the last 168 hours. No results for input(s): AMMONIA in the last 168 hours.  CBC: Recent Labs  Lab 01/05/20 0450 01/08/20 0519  WBC 10.3 10.1  HGB 11.4* 11.3*  HCT 36.8* 36.5*  MCV 93.6 94.3  PLT 353 386    Cardiac Enzymes: No results for input(s): CKTOTAL,  CKMB, CKMBINDEX, TROPONINI in the last 168 hours.  BNP (last 3 results) No results for input(s): BNP in the last 8760 hours.  ProBNP (last 3 results) No results for input(s): PROBNP in the last 8760 hours.  Radiological Exams: DG CHEST PORT 1 VIEW  Result Date: 01/06/2020 CLINICAL DATA:  Fever and aspiration EXAM: PORTABLE CHEST 1 VIEW COMPARISON:  01/02/2019 FINDINGS: Tracheostomy tube tip is above the carina. Normal heart size. No pleural effusion or edema. Decreased lung volumes. Left basilar opacity is again noted which may represent atelectasis or pneumonia. Remaining portions of lungs are clear. IMPRESSION: Persistent left basilar opacity which may represent atelectasis or pneumonia. Electronically Signed   By: Signa Kell M.D.   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 Acute on chronic respiratory failure hypoxia plan is to continue with full support on assist control titrate oxygen continue pulmonary toilet 2. MSSA pneumonia treated basilar opacities are still noted in the left side we will continue with aggressive pulmonary toilet supportive care 3. Tracheostomy remains in place 4. Delirium slowly improving 5. Cerebral infarction no change   I have personally  seen and evaluated the patient, evaluated laboratory and imaging results, formulated the assessment and plan and placed orders. The Patient requires high complexity decision making with multiple systems involvement.  Rounds were done with the Respiratory Therapy Director and Staff therapists and discussed with nursing staff also.  Allyne Gee, MD [facility] Pulmonary Critical Care Medicine Sleep Medicine

## 2020-01-09 DIAGNOSIS — F05 Delirium due to known physiological condition: Secondary | ICD-10-CM | POA: Diagnosis not present

## 2020-01-09 DIAGNOSIS — I63549 Cerebral infarction due to unspecified occlusion or stenosis of unspecified cerebellar artery: Secondary | ICD-10-CM | POA: Diagnosis not present

## 2020-01-09 DIAGNOSIS — J15211 Pneumonia due to Methicillin susceptible Staphylococcus aureus: Secondary | ICD-10-CM | POA: Diagnosis not present

## 2020-01-09 DIAGNOSIS — J9621 Acute and chronic respiratory failure with hypoxia: Secondary | ICD-10-CM | POA: Diagnosis not present

## 2020-01-09 LAB — CULTURE, BLOOD (ROUTINE X 2)
Culture: NO GROWTH
Culture: NO GROWTH
Special Requests: ADEQUATE

## 2020-01-09 NOTE — Progress Notes (Addendum)
Pulmonary Critical Care Medicine [facility] [place]   PULMONARY CRITICAL CARE SERVICE  PROGRESS NOTE  Date of Service: 01/09/2020  Eivan Gallina  XTA:[id number]  DOB: September 11, 1946   DOA: Nov 28, 2019  Referring Physician: Carron Curie, MD  HPI: Melvin Howard is a 74 y.o. male seen for follow up of Acute on Chronic Respiratory Failure.  Patient failed pressure support trial twice today remains on full support on the ventilator assist control mode rate of 15 with FiO2 28%  Medications: Reviewed on Rounds  Physical Exam:  Vitals: Pulse 59 respiration 28 BP 107/67 O2 sat 99% temp 98 point  Ventilator Settings ventilator mode AC VC rate of 15 tidal volume 40 PEEP of 5 FiO2 28%  . General: Comfortable at this time . Eyes: Grossly normal lids, irises & conjunctiva . ENT: grossly tongue is normal . Neck: no obvious mass . Cardiovascular: S1 S2 normal no gallop . Respiratory: No rales or rhonchi noted . Abdomen: soft . Skin: no rash seen on limited exam . Musculoskeletal: not rigid . Psychiatric:unable to assess . Neurologic: no seizure no involuntary movements         Lab Data:   Basic Metabolic Panel: Recent Labs  Lab 01/05/20 0450 01/08/20 0519  NA 139 140  K 3.9 4.0  CL 100 98  CO2 27 28  GLUCOSE 142* 149*  BUN 21 34*  CREATININE 0.56* 0.57*  CALCIUM 8.3* 8.7*  MG 1.9 2.0  PHOS 3.9  --     ABG: Recent Labs  Lab 01/05/20 1750 01/06/20 0618  PHART 7.436 7.505*  PCO2ART 48.0 41.2  PO2ART 70.0* 81.5*  HCO3 31.7* 32.3*  O2SAT 93.7 97.0    Liver Function Tests: No results for input(s): AST, ALT, ALKPHOS, BILITOT, PROT, ALBUMIN in the last 168 hours. No results for input(s): LIPASE, AMYLASE in the last 168 hours. No results for input(s): AMMONIA in the last 168 hours.  CBC: Recent Labs  Lab 01/05/20 0450 01/08/20 0519  WBC 10.3 10.1  HGB 11.4* 11.3*  HCT 36.8* 36.5*  MCV 93.6 94.3  PLT 353 386    Cardiac Enzymes: No results  for input(s): CKTOTAL, CKMB, CKMBINDEX, TROPONINI in the last 168 hours.  BNP (last 3 results) No results for input(s): BNP in the last 8760 hours.  ProBNP (last 3 results) No results for input(s): PROBNP in the last 8760 hours.  Radiological Exams: No results found.  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 Acute on chronic respiratory failure hypoxia plan is to continue with full support on assist control titrate oxygen continue pulmonary toilet 2. MSSA pneumonia treated basilar opacities are still noted in the left side we will continue with aggressive pulmonary toilet supportive care 3. Tracheostomy remains in place 4. Delirium slowly improving 5. Cerebral infarction no change   I have personally seen and evaluated the patient, evaluated laboratory and imaging results, formulated the assessment and plan and placed orders. The Patient requires high complexity decision making with multiple systems involvement.  Rounds were done with the Respiratory Therapy Director and Staff therapists and discussed with nursing staff also.  Yevonne Pax, MD [facility] Pulmonary Critical Care Medicine Sleep Medicine

## 2020-01-10 DIAGNOSIS — J9621 Acute and chronic respiratory failure with hypoxia: Secondary | ICD-10-CM | POA: Diagnosis not present

## 2020-01-10 DIAGNOSIS — I63549 Cerebral infarction due to unspecified occlusion or stenosis of unspecified cerebellar artery: Secondary | ICD-10-CM | POA: Diagnosis not present

## 2020-01-10 DIAGNOSIS — J15211 Pneumonia due to Methicillin susceptible Staphylococcus aureus: Secondary | ICD-10-CM | POA: Diagnosis not present

## 2020-01-10 DIAGNOSIS — F05 Delirium due to known physiological condition: Secondary | ICD-10-CM | POA: Diagnosis not present

## 2020-01-10 NOTE — Progress Notes (Addendum)
Pulmonary Critical Care Medicine [facility] [place]   PULMONARY CRITICAL CARE SERVICE  PROGRESS NOTE  Date of Service: 01/10/2020  Melvin Howard  KTG:[id number]  DOB: 29-Jun-1946   DOA: 11/06/2019  Referring Physician: Carron Curie, MD  HPI: Melvin Howard is a 74 y.o. male seen for follow up of Acute on Chronic Respiratory Failure.  Patient blood pressure support very remains on full support on the ventilator no distress noted at this time.  Medications: Reviewed on Rounds  Physical Exam:  Vitals: Pulse 62 respirations 20 BP 103/63 O2 sat 99% temp 98.1  Ventilator Settings ventilator mode AC VC rate 15 tidal 11/[birth year]/1948 PEEP 5 and FiO2 20%  . General: Comfortable at this time . Eyes: Grossly normal lids, irises & conjunctiva . ENT: grossly tongue is normal . Neck: no obvious mass . Cardiovascular: S1 S2 normal no gallop . Respiratory: No rales or rhonchi noted . Abdomen: soft . Skin: no rash seen on limited exam . Musculoskeletal: not rigid . Psychiatric:unable to assess . Neurologic: no seizure no involuntary movements         Lab Data:   Basic Metabolic Panel: Recent Labs  Lab 01/05/20 0450 01/08/20 0519  NA 139 140  K 3.9 4.0  CL 100 98  CO2 27 28  GLUCOSE 142* 149*  BUN 21 34*  CREATININE 0.56* 0.57*  CALCIUM 8.3* 8.7*  MG 1.9 2.0  PHOS 3.9  --     ABG: Recent Labs  Lab 01/05/20 1750 01/06/20 0618  PHART 7.436 7.505*  PCO2ART 48.0 41.2  PO2ART 70.0* 81.5*  HCO3 31.7* 32.3*  O2SAT 93.7 97.0    Liver Function Tests: No results for input(s): AST, ALT, ALKPHOS, BILITOT, PROT, ALBUMIN in the last 168 hours. No results for input(s): LIPASE, AMYLASE in the last 168 hours. No results for input(s): AMMONIA in the last 168 hours.  CBC: Recent Labs  Lab 01/05/20 0450 01/08/20 0519  WBC 10.3 10.1  HGB 11.4* 11.3*  HCT 36.8* 36.5*  MCV 93.6 94.3  PLT 353 386    Cardiac Enzymes: No results for input(s): CKTOTAL, CKMB,  CKMBINDEX, TROPONINI in the last 168 hours.  BNP (last 3 results) No results for input(s): BNP in the last 8760 hours.  ProBNP (last 3 results) No results for input(s): PROBNP in the last 8760 hours.  Radiological Exams: No results found.  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 Acute on chronic respiratory failure hypoxia plan is to continue with full support on assist control titrate oxygen continue pulmonary toilet 2. MSSA pneumonia treated basilar opacities are still noted in the left side we will continue with aggressive pulmonary toilet supportive care 3. Tracheostomy remains in place 4. Delirium slowly improving 5. Cerebral infarction no change   I have personally seen and evaluated the patient, evaluated laboratory and imaging results, formulated the assessment and plan and placed orders. The Patient requires high complexity decision making with multiple systems involvement.  Rounds were done with the Respiratory Therapy Director and Staff therapists and discussed with nursing staff also.  Yevonne Pax, MD [facility] Pulmonary Critical Care Medicine Sleep Medicine

## 2020-01-11 DIAGNOSIS — J15211 Pneumonia due to Methicillin susceptible Staphylococcus aureus: Secondary | ICD-10-CM | POA: Diagnosis not present

## 2020-01-11 DIAGNOSIS — I63549 Cerebral infarction due to unspecified occlusion or stenosis of unspecified cerebellar artery: Secondary | ICD-10-CM | POA: Diagnosis not present

## 2020-01-11 DIAGNOSIS — F05 Delirium due to known physiological condition: Secondary | ICD-10-CM | POA: Diagnosis not present

## 2020-01-11 DIAGNOSIS — J9621 Acute and chronic respiratory failure with hypoxia: Secondary | ICD-10-CM | POA: Diagnosis not present

## 2020-01-11 NOTE — Progress Notes (Addendum)
Pulmonary Critical Care Medicine [facility] [place]   PULMONARY CRITICAL CARE SERVICE  PROGRESS NOTE  Date of Service: 01/11/2020  Melvin Howard  ZOX:[id number]  DOB: [birth year]   DOA: 11/16/2019  Referring Physician: Carron Curie, MD  HPI: Melvin Howard is a 74 y.o. male seen for follow up of Acute on Chronic Respiratory Failure.  Patient failed wean today remains on full support ventilator support at this time  Medications: Reviewed on Rounds  Physical Exam:  Vitals: Pulse 68 respirations 22 BP 116/65 O2 sat 1% temp 97.9  Ventilator Settings ventilator mode AC PC rate 15 tidal 11/30/1950 PEEP of 5 FiO2 28%  . General: Comfortable at this time . Eyes: Grossly normal lids, irises & conjunctiva . ENT: grossly tongue is normal . Neck: no obvious mass . Cardiovascular: S1 S2 normal no gallop . Respiratory: No rales or rhonchi noted . Abdomen: soft . Skin: no rash seen on limited exam . Musculoskeletal: not rigid . Psychiatric:unable to assess . Neurologic: no seizure no involuntary movements         Lab Data:   Basic Metabolic Panel: Recent Labs  Lab 01/05/20 0450 01/08/20 0519  NA 139 140  K 3.9 4.0  CL 100 98  CO2 27 28  GLUCOSE 142* 149*  BUN 21 34*  CREATININE 0.56* 0.57*  CALCIUM 8.3* 8.7*  MG 1.9 2.0  PHOS 3.9  --     ABG: Recent Labs  Lab 01/05/20 1750 01/06/20 0618  PHART 7.436 7.505*  PCO2ART 48.0 41.2  PO2ART 70.0* 81.5*  HCO3 31.7* 32.3*  O2SAT 93.7 97.0    Liver Function Tests: No results for input(s): AST, ALT, ALKPHOS, BILITOT, PROT, ALBUMIN in the last 168 hours. No results for input(s): LIPASE, AMYLASE in the last 168 hours. No results for input(s): AMMONIA in the last 168 hours.  CBC: Recent Labs  Lab 01/05/20 0450 01/08/20 0519  WBC 10.3 10.1  HGB 11.4* 11.3*  HCT 36.8* 36.5*  MCV 93.6 94.3  PLT 353 386    Cardiac Enzymes: No results for input(s): CKTOTAL, CKMB, CKMBINDEX, TROPONINI in the last  168 hours.  BNP (last 3 results) No results for input(s): BNP in the last 8760 hours.  ProBNP (last 3 results) No results for input(s): PROBNP in the last 8760 hours.  Radiological Exams: No results found.  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 Acute on chronic respiratory failure hypoxia plan is to continue with full support on assist control titrate oxygen continue pulmonary toilet 2. MSSA pneumonia treated basilar opacities are still noted in the left side we will continue with aggressive pulmonary toilet supportive care 3. Tracheostomy remains in place 4. Delirium slowly improving 5. Cerebral infarction no change    I have personally seen and evaluated the patient, evaluated laboratory and imaging results, formulated the assessment and plan and placed orders. The Patient requires high complexity decision making with multiple systems involvement.  Rounds were done with the Respiratory Therapy Director and Staff therapists and discussed with nursing staff also.  Yevonne Pax, MD [facility] Pulmonary Critical Care Medicine Sleep Medicine

## 2020-01-12 DIAGNOSIS — J9621 Acute and chronic respiratory failure with hypoxia: Secondary | ICD-10-CM | POA: Diagnosis not present

## 2020-01-12 DIAGNOSIS — I63549 Cerebral infarction due to unspecified occlusion or stenosis of unspecified cerebellar artery: Secondary | ICD-10-CM | POA: Diagnosis not present

## 2020-01-12 DIAGNOSIS — F05 Delirium due to known physiological condition: Secondary | ICD-10-CM | POA: Diagnosis not present

## 2020-01-12 DIAGNOSIS — J15211 Pneumonia due to Methicillin susceptible Staphylococcus aureus: Secondary | ICD-10-CM | POA: Diagnosis not present

## 2020-01-12 LAB — BASIC METABOLIC PANEL
Anion gap: 13 (ref 5–15)
BUN: 21 mg/dL (ref 8–23)
CO2: 29 mmol/L (ref 22–32)
Calcium: 8.8 mg/dL — ABNORMAL LOW (ref 8.9–10.3)
Chloride: 99 mmol/L (ref 98–111)
Creatinine, Ser: 0.54 mg/dL — ABNORMAL LOW (ref 0.61–1.24)
GFR calc Af Amer: >60 mL/min (ref >60)
GFR calc non Af Amer: >60 mL/min (ref >60)
Glucose, Bld: 108 mg/dL — ABNORMAL HIGH (ref 70–99)
Potassium: 4.3 mmol/L (ref 3.5–5.1)
Sodium: 141 mmol/L (ref 135–145)

## 2020-01-12 LAB — MAGNESIUM: Magnesium: 2 mg/dL (ref 1.7–2.4)

## 2020-01-12 LAB — CBC
HCT: 38.1 % — ABNORMAL LOW (ref 39.0–52.0)
Hemoglobin: 12.1 g/dL — ABNORMAL LOW (ref 13.0–17.0)
MCH: 29.5 pg (ref 26.0–34.0)
MCHC: 31.8 g/dL (ref 30.0–36.0)
MCV: 92.9 fL (ref 80.0–100.0)
Platelets: 457 10*3/uL — ABNORMAL HIGH (ref 150–400)
RBC: 4.1 MIL/uL — ABNORMAL LOW (ref 4.22–5.81)
RDW: 15.8 % — ABNORMAL HIGH (ref 11.5–15.5)
WBC: 11.9 10*3/uL — ABNORMAL HIGH (ref 4.0–10.5)
nRBC: 0.6 % — ABNORMAL HIGH (ref 0.0–0.2)

## 2020-01-12 NOTE — Progress Notes (Signed)
Pulmonary Critical Care Medicine [facility] [place]   PULMONARY CRITICAL CARE SERVICE  PROGRESS NOTE  Date of Service: 01/12/2020  Melvin Howard  DPO:[id number]  DOB: 1946-11-01   DOA: 10/31/2019  Referring Physician: Carron Curie, MD  HPI: Melvin Howard is a 74 y.o. male seen for follow up of Acute on Chronic Respiratory Failure.  Patient is on full support right now on pressure support on the ventilator 12/5 the goal is for about 16 hours today  Medications: Reviewed on Rounds  Physical Exam:  Vitals: Temperature 98.2 pulse 65 respiratory rate 24 blood pressure is 125/72 saturations 98%  Ventilator Settings mode of ventilation pressure support FiO2 28% pressure support 12 PEEP 5  . General: Comfortable at this time . Eyes: Grossly normal lids, irises & conjunctiva . ENT: grossly tongue is normal . Neck: no obvious mass . Cardiovascular: S1 S2 normal no gallop . Respiratory: No rhonchi no rales are noted at this time . Abdomen: soft . Skin: no rash seen on limited exam . Musculoskeletal: not rigid . Psychiatric:unable to assess . Neurologic: no seizure no involuntary movements         Lab Data:   Basic Metabolic Panel: Recent Labs  Lab 01/08/20 0519 01/12/20 0750  NA 140 141  K 4.0 4.3  CL 98 99  CO2 28 29  GLUCOSE 149* 108*  BUN 34* 21  CREATININE 0.57* 0.54*  CALCIUM 8.7* 8.8*  MG 2.0 2.0    ABG: Recent Labs  Lab 01/05/20 1750 01/06/20 0618  PHART 7.436 7.505*  PCO2ART 48.0 41.2  PO2ART 70.0* 81.5*  HCO3 31.7* 32.3*  O2SAT 93.7 97.0    Liver Function Tests: No results for input(s): AST, ALT, ALKPHOS, BILITOT, PROT, ALBUMIN in the last 168 hours. No results for input(s): LIPASE, AMYLASE in the last 168 hours. No results for input(s): AMMONIA in the last 168 hours.  CBC: Recent Labs  Lab 01/08/20 0519 01/12/20 0750  WBC 10.1 11.9*  HGB 11.3* 12.1*  HCT 36.5* 38.1*  MCV 94.3 92.9  PLT 386 457*    Cardiac  Enzymes: No results for input(s): CKTOTAL, CKMB, CKMBINDEX, TROPONINI in the last 168 hours.  BNP (last 3 results) No results for input(s): BNP in the last 8760 hours.  ProBNP (last 3 results) No results for input(s): PROBNP in the last 8760 hours.  Radiological Exams: No results found.  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 Acute on chronic respiratory failure with hypoxia plan is to continue to pursue weaning on pressure support 12/5 patient is goal is for 16 hours so far appears to be tolerating it well 2. MSSA pneumonia treated we will continue with supportive care 3. Delirium no change 4. Cerebellar infarction no change 5. Tracheostomy remains in place and is on the wean   I have personally seen and evaluated the patient, evaluated laboratory and imaging results, formulated the assessment and plan and placed orders. The Patient requires high complexity decision making with multiple systems involvement.  Rounds were done with the Respiratory Therapy Director and Staff therapists and discussed with nursing staff also.  Yevonne Pax, MD [facility] Pulmonary Critical Care Medicine Sleep Medicine

## 2020-01-13 DIAGNOSIS — J9621 Acute and chronic respiratory failure with hypoxia: Secondary | ICD-10-CM | POA: Diagnosis not present

## 2020-01-13 DIAGNOSIS — F05 Delirium due to known physiological condition: Secondary | ICD-10-CM | POA: Diagnosis not present

## 2020-01-13 DIAGNOSIS — J15211 Pneumonia due to Methicillin susceptible Staphylococcus aureus: Secondary | ICD-10-CM | POA: Diagnosis not present

## 2020-01-13 DIAGNOSIS — I63549 Cerebral infarction due to unspecified occlusion or stenosis of unspecified cerebellar artery: Secondary | ICD-10-CM | POA: Diagnosis not present

## 2020-01-13 NOTE — Progress Notes (Signed)
Pulmonary Critical Care Medicine [facility] [place]   PULMONARY CRITICAL CARE SERVICE  PROGRESS NOTE  Date of Service: 01/13/2020  Melvin Howard  IHK:[id number]  DOB: 1946/09/21   DOA: 12/18/2019  Referring Physician: Carron Curie, MD  HPI: Melvin Howard is a 74 y.o. male seen for follow up of Acute on Chronic Respiratory Failure.  Patient is on pressure support mode currently is on 28% FiO2 has been doing up to 12 hours of weaning via pressure support.  Medications: Reviewed on Rounds  Physical Exam:  Vitals: Temperature is 97.0 pulse 96 respiratory rate is 22 blood pressure is 119/41 saturations 95%  Ventilator Settings on pressure support FiO2 is 28% pressure support 12 PEEP 5  . General: Comfortable at this time . Eyes: Grossly normal lids, irises & conjunctiva . ENT: grossly tongue is normal . Neck: no obvious mass . Cardiovascular: S1 S2 normal no gallop . Respiratory: Coarse rhonchi expansion is equal . Abdomen: soft . Skin: no rash seen on limited exam . Musculoskeletal: not rigid . Psychiatric:unable to assess . Neurologic: no seizure no involuntary movements         Lab Data:   Basic Metabolic Panel: Recent Labs  Lab 01/08/20 0519 01/12/20 0750  NA 140 141  K 4.0 4.3  CL 98 99  CO2 28 29  GLUCOSE 149* 108*  BUN 34* 21  CREATININE 0.57* 0.54*  CALCIUM 8.7* 8.8*  MG 2.0 2.0    ABG: No results for input(s): PHART, PCO2ART, PO2ART, HCO3, O2SAT in the last 168 hours.  Liver Function Tests: No results for input(s): AST, ALT, ALKPHOS, BILITOT, PROT, ALBUMIN in the last 168 hours. No results for input(s): LIPASE, AMYLASE in the last 168 hours. No results for input(s): AMMONIA in the last 168 hours.  CBC: Recent Labs  Lab 01/08/20 0519 01/12/20 0750  WBC 10.1 11.9*  HGB 11.3* 12.1*  HCT 36.5* 38.1*  MCV 94.3 92.9  PLT 386 457*    Cardiac Enzymes: No results for input(s): CKTOTAL, CKMB, CKMBINDEX, TROPONINI in the last  168 hours.  BNP (last 3 results) No results for input(s): BNP in the last 8760 hours.  ProBNP (last 3 results) No results for input(s): PROBNP in the last 8760 hours.  Radiological Exams: No results found.  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 Acute on chronic respiratory failure with hypoxia we will continue with pressure support mode titrate oxygen continue pulmonary toilet. 2. MSSA pneumonia treated we will continue to follow 3. Delirium no change 4. Cerebellar infarction no change 5. Tracheostomy remains in place   I have personally seen and evaluated the patient, evaluated laboratory and imaging results, formulated the assessment and plan and placed orders. The Patient requires high complexity decision making with multiple systems involvement.  Rounds were done with the Respiratory Therapy Director and Staff therapists and discussed with nursing staff also.  Yevonne Pax, MD [facility] Pulmonary Critical Care Medicine Sleep Medicine

## 2020-01-14 DIAGNOSIS — J15211 Pneumonia due to Methicillin susceptible Staphylococcus aureus: Secondary | ICD-10-CM | POA: Diagnosis not present

## 2020-01-14 DIAGNOSIS — J9621 Acute and chronic respiratory failure with hypoxia: Secondary | ICD-10-CM | POA: Diagnosis not present

## 2020-01-14 DIAGNOSIS — I63549 Cerebral infarction due to unspecified occlusion or stenosis of unspecified cerebellar artery: Secondary | ICD-10-CM | POA: Diagnosis not present

## 2020-01-14 DIAGNOSIS — F05 Delirium due to known physiological condition: Secondary | ICD-10-CM | POA: Diagnosis not present

## 2020-01-14 NOTE — Progress Notes (Signed)
Pulmonary Critical Care Medicine [facility] [place]   PULMONARY CRITICAL CARE SERVICE  PROGRESS NOTE  Date of Service: 01/14/2020  Melvin Howard  BOF:[id number]  DOB: 1946/03/12   DOA: 11/05/2019  Referring Physician: Carron Curie, MD  HPI: Melvin Howard is a 74 y.o. male seen for follow up of Acute on Chronic Respiratory Failure.  Patient is on full support currently on assist control mode has been on 28% FiO2 with good saturations mechanics have been poor and is not able to tolerate weaning.  The case management is going to have conversation with the family regarding goals of care daughter may actually come and visit the patient before deciding.  Medications: Reviewed on Rounds  Physical Exam:  Vitals: Temperature is 96.6 pulse 65 respiratory 28 blood pressure is 104/71 saturations 100%  Ventilator Settings on assist control FiO2 is 28% tidal volume 484 with PEEP of 5  . General: Comfortable at this time . Eyes: Grossly normal lids, irises & conjunctiva . ENT: grossly tongue is normal . Neck: no obvious mass . Cardiovascular: S1 S2 normal no gallop . Respiratory: No rhonchi no rales are noted at this time . Abdomen: soft . Skin: no rash seen on limited exam . Musculoskeletal: not rigid . Psychiatric:unable to assess . Neurologic: no seizure no involuntary movements         Lab Data:   Basic Metabolic Panel: Recent Labs  Lab 01/08/20 0519 01/12/20 0750  NA 140 141  K 4.0 4.3  CL 98 99  CO2 28 29  GLUCOSE 149* 108*  BUN 34* 21  CREATININE 0.57* 0.54*  CALCIUM 8.7* 8.8*  MG 2.0 2.0    ABG: No results for input(s): PHART, PCO2ART, PO2ART, HCO3, O2SAT in the last 168 hours.  Liver Function Tests: No results for input(s): AST, ALT, ALKPHOS, BILITOT, PROT, ALBUMIN in the last 168 hours. No results for input(s): LIPASE, AMYLASE in the last 168 hours. No results for input(s): AMMONIA in the last 168 hours.  CBC: Recent Labs  Lab  01/08/20 0519 01/12/20 0750  WBC 10.1 11.9*  HGB 11.3* 12.1*  HCT 36.5* 38.1*  MCV 94.3 92.9  PLT 386 457*    Cardiac Enzymes: No results for input(s): CKTOTAL, CKMB, CKMBINDEX, TROPONINI in the last 168 hours.  BNP (last 3 results) No results for input(s): BNP in the last 8760 hours.  ProBNP (last 3 results) No results for input(s): PROBNP in the last 8760 hours.  Radiological Exams: No results found.  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 Acute on chronic respiratory failure with hypoxia plan is to continue with full support on the ventilator for now until family is able to make a decision regarding goals of care 2. MSSA pneumonia treated we will continue with supportive care 3. Tracheostomy remains in place 4. Delirium no change 5. Cerebellar infarction no improvement no change   I have personally seen and evaluated the patient, evaluated laboratory and imaging results, formulated the assessment and plan and placed orders. The Patient requires high complexity decision making with multiple systems involvement.  Rounds were done with the Respiratory Therapy Director and Staff therapists and discussed with nursing staff also.  Yevonne Pax, MD [facility] Pulmonary Critical Care Medicine Sleep Medicine

## 2020-01-15 DIAGNOSIS — F05 Delirium due to known physiological condition: Secondary | ICD-10-CM | POA: Diagnosis not present

## 2020-01-15 DIAGNOSIS — J9621 Acute and chronic respiratory failure with hypoxia: Secondary | ICD-10-CM | POA: Diagnosis not present

## 2020-01-15 DIAGNOSIS — I63549 Cerebral infarction due to unspecified occlusion or stenosis of unspecified cerebellar artery: Secondary | ICD-10-CM | POA: Diagnosis not present

## 2020-01-15 DIAGNOSIS — J15211 Pneumonia due to Methicillin susceptible Staphylococcus aureus: Secondary | ICD-10-CM | POA: Diagnosis not present

## 2020-01-15 LAB — CBC
HCT: 38.1 % — ABNORMAL LOW (ref 39.0–52.0)
Hemoglobin: 11.8 g/dL — ABNORMAL LOW (ref 13.0–17.0)
MCH: 29.1 pg (ref 26.0–34.0)
MCHC: 31 g/dL (ref 30.0–36.0)
MCV: 93.8 fL (ref 80.0–100.0)
Platelets: 414 10*3/uL — ABNORMAL HIGH (ref 150–400)
RBC: 4.06 MIL/uL — ABNORMAL LOW (ref 4.22–5.81)
RDW: 15.7 % — ABNORMAL HIGH (ref 11.5–15.5)
WBC: 10.9 10*3/uL — ABNORMAL HIGH (ref 4.0–10.5)
nRBC: 0 % (ref 0.0–0.2)

## 2020-01-15 LAB — BASIC METABOLIC PANEL
Anion gap: 10 (ref 5–15)
BUN: 25 mg/dL — ABNORMAL HIGH (ref 8–23)
CO2: 26 mmol/L (ref 22–32)
Calcium: 8.8 mg/dL — ABNORMAL LOW (ref 8.9–10.3)
Chloride: 102 mmol/L (ref 98–111)
Creatinine, Ser: 0.56 mg/dL — ABNORMAL LOW (ref 0.61–1.24)
GFR calc Af Amer: >60 mL/min (ref >60)
GFR calc non Af Amer: >60 mL/min (ref >60)
Glucose, Bld: 109 mg/dL — ABNORMAL HIGH (ref 70–99)
Potassium: 4.3 mmol/L (ref 3.5–5.1)
Sodium: 138 mmol/L (ref 135–145)

## 2020-01-15 LAB — PHOSPHORUS: Phosphorus: 3.5 mg/dL (ref 2.5–4.6)

## 2020-01-15 LAB — MAGNESIUM: Magnesium: 1.9 mg/dL (ref 1.7–2.4)

## 2020-01-15 NOTE — Progress Notes (Signed)
Pulmonary Critical Care Medicine [facility] [place]   PULMONARY CRITICAL CARE SERVICE  PROGRESS NOTE  Date of Service: 01/15/2020  Melvin Howard  KDT:[id number]  DOB: [birth year]   DOA: 11/12/2019  Referring Physician: Carron Curie, MD  HPI: Melvin Howard is a 74 y.o. male seen for follow up of Acute on Chronic Respiratory Failure.  Patient was attempted at weaning and failed once again.  Right now is back on assist control mode on a PEEP of 5  Medications: Reviewed on Rounds  Physical Exam:  Vitals: Temperature is 97.8 pulse 82 respiratory 24 blood pressure is 108/68 saturations 98%  Ventilator Settings mode of ventilation assist control FiO2 is 28% PEEP 5 tidal volume is 370  . General: Comfortable at this time . Eyes: Grossly normal lids, irises & conjunctiva . ENT: grossly tongue is normal . Neck: no obvious mass . Cardiovascular: S1 S2 normal no gallop . Respiratory: Coarse rhonchi expansion is equal . Abdomen: soft . Skin: no rash seen on limited exam . Musculoskeletal: not rigid . Psychiatric:unable to assess . Neurologic: no seizure no involuntary movements         Lab Data:   Basic Metabolic Panel: Recent Labs  Lab 01/12/20 0750 01/15/20 0544  NA 141 138  K 4.3 4.3  CL 99 102  CO2 29 26  GLUCOSE 108* 109*  BUN 21 25*  CREATININE 0.54* 0.56*  CALCIUM 8.8* 8.8*  MG 2.0 1.9  PHOS  --  3.5    ABG: No results for input(s): PHART, PCO2ART, PO2ART, HCO3, O2SAT in the last 168 hours.  Liver Function Tests: No results for input(s): AST, ALT, ALKPHOS, BILITOT, PROT, ALBUMIN in the last 168 hours. No results for input(s): LIPASE, AMYLASE in the last 168 hours. No results for input(s): AMMONIA in the last 168 hours.  CBC: Recent Labs  Lab 01/12/20 0750 01/15/20 0544  WBC 11.9* 10.9*  HGB 12.1* 11.8*  HCT 38.1* 38.1*  MCV 92.9 93.8  PLT 457* 414*    Cardiac Enzymes: No results for input(s): CKTOTAL, CKMB, CKMBINDEX,  TROPONINI in the last 168 hours.  BNP (last 3 results) No results for input(s): BNP in the last 8760 hours.  ProBNP (last 3 results) No results for input(s): PROBNP in the last 8760 hours.  Radiological Exams: No results found.  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 Acute on chronic respiratory failure with hypoxia patient currently is on full support on the ventilator because of failing attempts at weaning 2. MSSA pneumonia treated we will continue present management 3. Tracheostomy is at baseline 4. Delirium major improvement noted 5. Cerebellar infarction no improvement clinically   I have personally seen and evaluated the patient, evaluated laboratory and imaging results, formulated the assessment and plan and placed orders. The Patient requires high complexity decision making with multiple systems involvement.  Rounds were done with the Respiratory Therapy Director and Staff therapists and discussed with nursing staff also.  Yevonne Pax, MD [facility] Pulmonary Critical Care Medicine Sleep Medicine

## 2020-01-15 NOTE — Progress Notes (Signed)
PROGRESS NOTE    Melvin Howard  ZOX:[id number] DOB: 01/04/1946 DOA: 10/29/2019   Brief Narrative:  Melvin Howard is an 74 y.o. male with history of stroke, cerebellar infarction, pneumonia who initially presented to [facility] with projectile vomiting, severe headache, dysphagia, dysarthria.  He was noted to have ataxia.  Head CT showed large left cerebellar infarction.  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  He was admitted to the neurosurgical ICU and was started on aspirin.  Hospital course complicated by respiratory failure and he had to be intubated.  He had tracheostomy done on 11/03/2019.  He did not tolerate weaning trials.  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  Biopsy showed benign mucosa.  His hospital course was also complicated by MSSA pneumonia and tracheobronchitis.  Chest x-ray at the outside facility showed opacification with cultures that showed MSSA.  He also had acute renal failure, hyponatremia.  He had episodes of delirium, agitation, questionable dementia.  Due to his complex medical problems he was transferred and [facility]12[facility].  He has had recurrent pneumonia with MSSA.  He has received several rounds of antibiotic treatment including with IV vancomycin, nafcillin, Ancef, Unasyn.  Blood cultures have been negative.  Recent respiratory cultures from 01/04/2020 again showed MSSA.  Per primary team his trach has been changed.  Chest x-ray from previously showed persistent left basilar opacity likely atelectasis versus pneumonia.  Urine culture showed E. coli.  He is currently on 28% FiO2, PEEP of 5.  He is unresponsive, does not follow any commands.   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 Coli Recurrent pneumonia Systemic inflammatory response syndrome Acute kidney injury Dysphagia Protein calorie malnutrition Left palatine tonsil mass  Acute on chronic hypoxemic respiratory failure: Multifactorial.  He has had recurrent pneumonia with MSSA.  He has received several rounds of antibiotic treatment.  He received treatment with IV vancomycin, nafcillin, Ancef, Unasyn.  Recent respiratory cultures again showed MSSA. Colonization versus actual infection?  However, he was having low-grade fevers. Recommended ciprofloxacin.  We will plan to treat for total duration of 10-14 days pending improvement.  Per the primary team his trach has been recently changed.  He also has dysphagia given the recent stroke and high suspicion for ongoing aspiration which likely places him at a very high risk for worsening pneumonia, worsening respiratory failure despite being on antibiotics. If respiratory status does not improve would recommend to obtain chest CT to better evaluate. Pulmonary following.  Pneumonia secondary to MSSA: As mentioned above he has had pneumonia tracheobronchitis and respiratory cultures showed MSSA.  Colonization versus pneumonia?  He was having low-grade fevers.  He was already treated with IV vancomycin, nafcillin, Ancef, Unasyn.  Most recent sputum cultures again showed MSSA.  Per the primary team his trach has been recently changed.  He was started on IV cefepime.  Recommended to discontinue cefepime and switched him to ciprofloxacin.  Plan to treat for total duration of 10-14 days pending improvement.  As mentioned above, there is also high concern for ongoing aspiration which places him at a high  risk for recurrent and worsening pneumonia despite being on antibiotics.  Systemic inflammatory  response syndrome: Likely secondary to UTI/pneumonia.  Urine cultures showed E. coli and sputum culture showed MSSA. Was on cefepime.  Switched to ciprofloxacin.  Continue to monitor.  He is at high risk for recurrent sepsis.  UTI: As mentioned above urine culture showed E. coli.  Antibiotics and plan as mentioned above.  Dysphagia: Due to his dysphagia he is high risk for aspiration and worsening respiratory failure secondary to aspiration pneumonia.  Left posterior/inferior cerebellar artery infarction: Involving the cerebellum and medulla secondary to left vertebral artery dissection.  On aspirin and statins.  Further management per primary team.  Protein calorie malnutrition: Management per primary team.  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  He will need ENT follow-up once his acute issues resolved.  Acute kidney injury: Improving. Continue to monitor BUN/trending closely especially while on antibiotics.  Other management per primary team.  Avoid nephrotoxic medications.  Delirium/agitation: Questionable dementia with encephalopathy.  Continue medications and management per primary team.  Unfortunately due to his complex medical problems he is very high risk for worsening and decompensation.  Plan of care discussed with the primary team.    Subjective: He remains encephalopathic. On 28% FiO2, PEEP 5.  Objective: Vitals: Temp 98.8, pulse 72, RR 21, pulse ox 97%  Examination:  General: Ill-appearing male, encephalopathic, not following any commands at this time Head/eyes: Atraumatic, normocephalic, PERLA ENMT: external ears and nose appear normal Neck: Has trach in place CVS: S1-S2 clear, no murmur   Respiratory: Rhonchi, no wheezing Abdomen: soft, nondistended, has abdominal binder in place, normal bowel sounds  Musculoskeletal: No edema or contractures Neuro: Patient encephalopathy, not following any commands at  this time.  Unable to do a neurologic exam. Psych: Unable to assess Skin: no rashes    Data Reviewed: I have personally reviewed following labs and imaging studies  CBC: Recent Labs  Lab 01/12/20 0750 01/15/20 0544  WBC 11.9* 10.9*  HGB 12.1* 11.8*  HCT 38.1* 38.1*  MCV 92.9 93.8  PLT 457* 414*   Basic Metabolic Panel: Recent Labs  Lab 01/12/20 0750 01/15/20 0544  NA 141 138  K 4.3 4.3  CL 99 102  CO2 29 26  GLUCOSE 108* 109*  BUN 21 25*  CREATININE 0.54* 0.56*  CALCIUM 8.8* 8.8*  MG 2.0 1.9  PHOS  --  3.5   GFR: CrCl cannot be calculated (Unknown ideal weight.). Liver Function Tests: No results for input(s): AST, ALT, ALKPHOS, BILITOT, PROT, ALBUMIN in the last 168 hours. No results for input(s): LIPASE, AMYLASE in the last 168 hours. No results for input(s): AMMONIA in the last 168 hours. Coagulation Profile: No results for input(s): INR, PROTIME in the last 168 hours. Cardiac Enzymes: No results for input(s): CKTOTAL, CKMB, CKMBINDEX, TROPONINI in the last 168 hours. BNP (last 3 results) No results for input(s): PROBNP in the last 8760 hours. HbA1C: No results for input(s): HGBA1C in the last 72 hours. CBG: No results for input(s): GLUCAP in the last 168 hours. Lipid Profile: No results for input(s): CHOL, HDL, LDLCALC, TRIG, CHOLHDL, LDLDIRECT in the last 72 hours. Thyroid Function Tests: No results for input(s): TSH, T4TOTAL, FREET4, T3FREE, THYROIDAB in the last 72 hours. Anemia Panel: No results for input(s): VITAMINB12, FOLATE, FERRITIN, TIBC, IRON, RETICCTPCT in the last 72 hours. Sepsis Labs: No results for input(s): PROCALCITON, LATICACIDVEN in the last 168 hours.  No results found  for this or any previous visit (from the past 240 hour(s)).       Radiology Studies: No results found.      Scheduled Meds: Please see MAR    Vonzella Nipple, MD   01/15/2020, 2:20 PM

## 2020-01-16 DIAGNOSIS — J15211 Pneumonia due to Methicillin susceptible Staphylococcus aureus: Secondary | ICD-10-CM | POA: Diagnosis not present

## 2020-01-16 DIAGNOSIS — F05 Delirium due to known physiological condition: Secondary | ICD-10-CM | POA: Diagnosis not present

## 2020-01-16 DIAGNOSIS — J9621 Acute and chronic respiratory failure with hypoxia: Secondary | ICD-10-CM | POA: Diagnosis not present

## 2020-01-16 DIAGNOSIS — I63549 Cerebral infarction due to unspecified occlusion or stenosis of unspecified cerebellar artery: Secondary | ICD-10-CM | POA: Diagnosis not present

## 2020-01-16 NOTE — Progress Notes (Signed)
Pulmonary Critical Care Medicine [facility] [place]   PULMONARY CRITICAL CARE SERVICE  PROGRESS NOTE  Date of Service: 01/16/2020  Aryon Nham  MOL:[id number]  DOB: 05/16/1946   DOA: 2019/12/14  Referring Physician: Carron Curie, MD  HPI: Melvin Howard is a 74 y.o. male seen for follow up of Acute on Chronic Respiratory Failure.  Currently is on assist control mode full support has not been tolerating attempts at weaning remains on 28% FiO2 with good volumes  Medications: Reviewed on Rounds  Physical Exam:  Vitals: Temperature is 98.0 pulse 60 respiratory 18 blood pressure 129/78 saturations 98%  Ventilator Settings on assist control with an FiO2 of 28%  . General: Comfortable at this time . Eyes: Grossly normal lids, irises & conjunctiva . ENT: grossly tongue is normal . Neck: no obvious mass . Cardiovascular: S1 S2 normal no gallop . Respiratory: No rhonchi no rales are noted at this time . Abdomen: soft . Skin: no rash seen on limited exam . Musculoskeletal: not rigid . Psychiatric:unable to assess . Neurologic: no seizure no involuntary movements         Lab Data:   Basic Metabolic Panel: Recent Labs  Lab 01/12/20 0750 01/15/20 0544  NA 141 138  K 4.3 4.3  CL 99 102  CO2 29 26  GLUCOSE 108* 109*  BUN 21 25*  CREATININE 0.54* 0.56*  CALCIUM 8.8* 8.8*  MG 2.0 1.9  PHOS  --  3.5    ABG: No results for input(s): PHART, PCO2ART, PO2ART, HCO3, O2SAT in the last 168 hours.  Liver Function Tests: No results for input(s): AST, ALT, ALKPHOS, BILITOT, PROT, ALBUMIN in the last 168 hours. No results for input(s): LIPASE, AMYLASE in the last 168 hours. No results for input(s): AMMONIA in the last 168 hours.  CBC: Recent Labs  Lab 01/12/20 0750 01/15/20 0544  WBC 11.9* 10.9*  HGB 12.1* 11.8*  HCT 38.1* 38.1*  MCV 92.9 93.8  PLT 457* 414*    Cardiac Enzymes: No results for input(s): CKTOTAL, CKMB, CKMBINDEX, TROPONINI in the  last 168 hours.  BNP (last 3 results) No results for input(s): BNP in the last 8760 hours.  ProBNP (last 3 results) No results for input(s): PROBNP in the last 8760 hours.  Radiological Exams: No results found.  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 Acute on chronic respiratory failure hypoxia plan is to continue with full support on the ventilator on assist control patient has been significantly failing weaning attempts. 2. MSSA pneumonia treated 3. Tracheostomy remains in place 4. Tolerated with little improvement noted 5. Cerebellar infarction neurologically unchanged   I have personally seen and evaluated the patient, evaluated laboratory and imaging results, formulated the assessment and plan and placed orders. The Patient requires high complexity decision making with multiple systems involvement.  Rounds were done with the Respiratory Therapy Director and Staff therapists and discussed with nursing staff also.  Yevonne Pax, MD [facility] Pulmonary Critical Care Medicine Sleep Medicine

## 2020-01-17 ENCOUNTER — Other Ambulatory Visit (HOSPITAL_COMMUNITY): Payer: Medicare Other

## 2020-01-17 DIAGNOSIS — I63549 Cerebral infarction due to unspecified occlusion or stenosis of unspecified cerebellar artery: Secondary | ICD-10-CM | POA: Diagnosis not present

## 2020-01-17 DIAGNOSIS — J15211 Pneumonia due to Methicillin susceptible Staphylococcus aureus: Secondary | ICD-10-CM | POA: Diagnosis not present

## 2020-01-17 DIAGNOSIS — F05 Delirium due to known physiological condition: Secondary | ICD-10-CM | POA: Diagnosis not present

## 2020-01-17 DIAGNOSIS — J9621 Acute and chronic respiratory failure with hypoxia: Secondary | ICD-10-CM | POA: Diagnosis not present

## 2020-01-17 NOTE — Progress Notes (Addendum)
Pulmonary Critical Care Medicine [facility] [place]   PULMONARY CRITICAL CARE SERVICE  PROGRESS NOTE  Date of Service: 01/17/2020  Melvin Howard  [id number]:[id number]  DOB: 1946/11/21   DOA: 11/23/2019  Referring Physician: Carron Curie, MD  HPI: Melvin Howard is a 74 y.o. male seen for follow up of Acute on Chronic Respiratory Failure.  Patient remains on full support on the ventilator at this time satting well no fever or distress.  Currently requiring 28% FiO2.  Medications: Reviewed on Rounds  Physical Exam:  Vitals: Pulse 69 respirations 26 BP 130/70 O2 sat 98% temp 96.5  Ventilator Settings ventilator mode AC VC rate of 15 tidal volume 450 PEEP of 5 and FiO2 of 28%  . General: Comfortable at this time . Eyes: Grossly normal lids, irises & conjunctiva . ENT: grossly tongue is normal . Neck: no obvious mass . Cardiovascular: S1 S2 normal no gallop . Respiratory: No rales or rhonchi noted . Abdomen: soft . Skin: no rash seen on limited exam . Musculoskeletal: not rigid . Psychiatric:unable to assess . Neurologic: no seizure no involuntary movements         Lab Data:   Basic Metabolic Panel: Recent Labs  Lab 01/12/20 0750 01/15/20 0544  NA 141 138  K 4.3 4.3  CL 99 102  CO2 29 26  GLUCOSE 108* 109*  BUN 21 25*  CREATININE 0.54* 0.56*  CALCIUM 8.8* 8.8*  MG 2.0 1.9  PHOS  --  3.5    ABG: No results for input(s): PHART, PCO2ART, PO2ART, HCO3, O2SAT in the last 168 hours.  Liver Function Tests: No results for input(s): AST, ALT, ALKPHOS, BILITOT, PROT, ALBUMIN in the last 168 hours. No results for input(s): LIPASE, AMYLASE in the last 168 hours. No results for input(s): AMMONIA in the last 168 hours.  CBC: Recent Labs  Lab 01/12/20 0750 01/15/20 0544  WBC 11.9* 10.9*  HGB 12.1* 11.8*  HCT 38.1* 38.1*  MCV 92.9 93.8  PLT 457* 414*    Cardiac Enzymes: No results for input(s): CKTOTAL, CKMB, CKMBINDEX, TROPONINI in the last 168  hours.  BNP (last 3 results) No results for input(s): BNP in the last 8760 hours.  ProBNP (last 3 results) No results for input(s): PROBNP in the last 8760 hours.  Radiological Exams: CT HEAD WO CONTRAST  Result Date: 01/17/2020 CLINICAL DATA:  Altered mental status today. EXAM: CT HEAD WITHOUT CONTRAST TECHNIQUE: Contiguous axial images were obtained from the base of the skull through the vertex without intravenous contrast. COMPARISON:  None. FINDINGS: Brain: No evidence of acute infarction, hemorrhage, hydrocephalus, extra-axial collection or mass lesion/mass effect. 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 Vascular: No hyperdense vessel or unexpected calcification. Skull: Intact.  No focal lesion. Sinuses/Orbits: Negative. Other: None. IMPRESSION: No acute intracranial normality. 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 Electronically Signed   By: Drusilla Kanner M.D.   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 Acute on chronic respiratory failure hypoxia plan is to continue with full support on the ventilator on assist control patient has been significantly failing weaning attempts. 2. MSSA pneumonia treated 3. Tracheostomy remains in place 4. Tolerated with little improvement noted 5. Cerebellar infarction neurologically unchanged  I have personally seen and evaluated the patient, evaluated laboratory and imaging results, formulated the assessment and plan and placed orders. The Patient requires high complexity decision making with multiple systems involvement.  Rounds were done with the Respiratory Therapy Director and Staff therapists and discussed  with nursing staff also.  Yevonne Pax, MD [facility] Pulmonary Critical Care Medicine Sleep Medicine

## 2020-01-18 DIAGNOSIS — F05 Delirium due to known physiological condition: Secondary | ICD-10-CM | POA: Diagnosis not present

## 2020-01-18 DIAGNOSIS — J15211 Pneumonia due to Methicillin susceptible Staphylococcus aureus: Secondary | ICD-10-CM | POA: Diagnosis not present

## 2020-01-18 DIAGNOSIS — I63549 Cerebral infarction due to unspecified occlusion or stenosis of unspecified cerebellar artery: Secondary | ICD-10-CM | POA: Diagnosis not present

## 2020-01-18 DIAGNOSIS — J9621 Acute and chronic respiratory failure with hypoxia: Secondary | ICD-10-CM | POA: Diagnosis not present

## 2020-01-18 NOTE — Progress Notes (Addendum)
Pulmonary Critical Care Medicine [place]   PULMONARY CRITICAL CARE SERVICE  PROGRESS NOTE  Date of Service: 01/18/2020  Melvin Howard  NLZ:[id number]  DOB: 09/17/1946   DOA: 22-Dec-2019  Referring Physician: Merton Border, MD  HPI: Melvin Howard is a 74 y.o. male seen for follow up of Acute on Chronic Respiratory Failure.  Patient continues on full support on the ventilator continues to require 20% FiO2 satting well no fever or distress.  Medications: Reviewed on Rounds  Physical Exam:  Vitals: Pulse 82 respirations 18 BP 112/60 O2 sat 95% temp 97.9  Ventilator Settings ventilator mode AC VC rate of 15 tidal volume 450 PEEP of 5 and FiO2 28%  . General: Comfortable at this time . Eyes: Grossly normal lids, irises & conjunctiva . ENT: grossly tongue is normal . Neck: no obvious mass . Cardiovascular: S1 S2 normal no gallop . Respiratory: No rales or rhonchi noted . Abdomen: soft . Skin: no rash seen on limited exam . Musculoskeletal: not rigid . Psychiatric:unable to assess . Neurologic: no seizure no involuntary movements         Lab Data:   Basic Metabolic Panel: Recent Labs  Lab 01/12/20 0750 01/15/20 0544  NA 141 138  K 4.3 4.3  CL 99 102  CO2 29 26  GLUCOSE 108* 109*  BUN 21 25*  CREATININE 0.54* 0.56*  CALCIUM 8.8* 8.8*  MG 2.0 1.9  PHOS  --  3.5    ABG: No results for input(s): PHART, PCO2ART, PO2ART, HCO3, O2SAT in the last 168 hours.  Liver Function Tests: No results for input(s): AST, ALT, ALKPHOS, BILITOT, PROT, ALBUMIN in the last 168 hours. No results for input(s): LIPASE, AMYLASE in the last 168 hours. No results for input(s): AMMONIA in the last 168 hours.  CBC: Recent Labs  Lab 01/12/20 0750 01/15/20 0544  WBC 11.9* 10.9*  HGB 12.1* 11.8*  HCT 38.1* 38.1*  MCV 92.9 93.8  PLT 457* 414*    Cardiac Enzymes: No results for input(s): CKTOTAL, CKMB, CKMBINDEX, TROPONINI in the last 168 hours.  BNP  (last 3 results) No results for input(s): BNP in the last 8760 hours.  ProBNP (last 3 results) No results for input(s): PROBNP in the last 8760 hours.  Radiological Exams: CT HEAD WO CONTRAST  Result Date: 01/17/2020 CLINICAL DATA:  Altered mental status today. EXAM: CT HEAD WITHOUT CONTRAST TECHNIQUE: Contiguous axial images were obtained from the base of the skull through the vertex without intravenous contrast. COMPARISON:  None. FINDINGS: Brain: No evidence of acute infarction, hemorrhage, hydrocephalus, extra-axial collection or mass lesion/mass effect. 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 Vascular: No hyperdense vessel or unexpected calcification. Skull: Intact.  No focal lesion. Sinuses/Orbits: Negative. Other: None. IMPRESSION: No acute intracranial normality. 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 Electronically Signed   By: Inge Rise M.D.   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 Acute on chronic respiratory failure hypoxia plan is to continue with full support on the ventilator on assist control patient has been significantly failing weaning attempts. 2. MSSA pneumonia treated 3. Tracheostomy remains in place 4. Tolerated with little improvement noted 5. Cerebellar infarction neurologically unchanged   I have  personally seen and evaluated the patient, evaluated laboratory and imaging results, formulated the assessment and plan and placed orders. The Patient requires high complexity decision making with multiple systems involvement.  Rounds were done with the Respiratory Therapy Director and Staff therapists and discussed with nursing  staff also.  Yevonne Pax, MD [facility] Pulmonary Critical Care Medicine Sleep Medicine

## 2020-01-19 DIAGNOSIS — I63549 Cerebral infarction due to unspecified occlusion or stenosis of unspecified cerebellar artery: Secondary | ICD-10-CM | POA: Diagnosis not present

## 2020-01-19 DIAGNOSIS — J9621 Acute and chronic respiratory failure with hypoxia: Secondary | ICD-10-CM | POA: Diagnosis not present

## 2020-01-19 DIAGNOSIS — J15211 Pneumonia due to Methicillin susceptible Staphylococcus aureus: Secondary | ICD-10-CM | POA: Diagnosis not present

## 2020-01-19 DIAGNOSIS — F05 Delirium due to known physiological condition: Secondary | ICD-10-CM | POA: Diagnosis not present

## 2020-01-19 LAB — BASIC METABOLIC PANEL
Anion gap: 14 (ref 5–15)
BUN: 26 mg/dL — ABNORMAL HIGH (ref 8–23)
CO2: 28 mmol/L (ref 22–32)
Calcium: 8.8 mg/dL — ABNORMAL LOW (ref 8.9–10.3)
Chloride: 95 mmol/L — ABNORMAL LOW (ref 98–111)
Creatinine, Ser: 0.6 mg/dL — ABNORMAL LOW (ref 0.61–1.24)
GFR calc Af Amer: >60 mL/min (ref >60)
GFR calc non Af Amer: >60 mL/min (ref >60)
Glucose, Bld: 137 mg/dL — ABNORMAL HIGH (ref 70–99)
Potassium: 4.2 mmol/L (ref 3.5–5.1)
Sodium: 137 mmol/L (ref 135–145)

## 2020-01-19 LAB — MAGNESIUM: Magnesium: 1.9 mg/dL (ref 1.7–2.4)

## 2020-01-19 LAB — CBC
HCT: 38.4 % — ABNORMAL LOW (ref 39.0–52.0)
Hemoglobin: 12.2 g/dL — ABNORMAL LOW (ref 13.0–17.0)
MCH: 28.9 pg (ref 26.0–34.0)
MCHC: 31.8 g/dL (ref 30.0–36.0)
MCV: 91 fL (ref 80.0–100.0)
Platelets: 346 10*3/uL (ref 150–400)
RBC: 4.22 MIL/uL (ref 4.22–5.81)
RDW: 15.4 % (ref 11.5–15.5)
WBC: 11.7 10*3/uL — ABNORMAL HIGH (ref 4.0–10.5)
nRBC: 0 % (ref 0.0–0.2)

## 2020-01-19 LAB — PHOSPHORUS: Phosphorus: 3.6 mg/dL (ref 2.5–4.6)

## 2020-01-19 NOTE — Progress Notes (Signed)
Pulmonary Critical Care Medicine [facility] [place]   PULMONARY CRITICAL CARE SERVICE  PROGRESS NOTE  Date of Service: 01/19/2020  Melvin Howard  CWC:[id number]  DOB: [birth year]   DOA: 11/08/2019  Referring Physician: Carron Curie, MD  HPI: Melvin Howard is a 74 y.o. male seen for follow up of Acute on Chronic Respiratory Failure.  He remains on the ventilator on full support has been failing weaning consistently  Medications: Reviewed on Rounds  Physical Exam:  Vitals: Temperature is 97.1 pulse 77 respiratory rate 20 blood pressure is 125/69 saturations 96%  Ventilator Settings on assist control FiO2 is 28% tidal volume 561 PEEP 5  . General: Comfortable at this time . Eyes: Grossly normal lids, irises & conjunctiva . ENT: grossly tongue is normal . Neck: no obvious mass . Cardiovascular: S1 S2 normal no gallop . Respiratory: No rhonchi no rales are noted at this time . Abdomen: soft . Skin: no rash seen on limited exam . Musculoskeletal: not rigid . Psychiatric:unable to assess . Neurologic: no seizure no involuntary movements         Lab Data:   Basic Metabolic Panel: Recent Labs  Lab 01/15/20 0544 01/19/20 0613  NA 138 137  K 4.3 4.2  CL 102 95*  CO2 26 28  GLUCOSE 109* 137*  BUN 25* 26*  CREATININE 0.56* 0.60*  CALCIUM 8.8* 8.8*  MG 1.9 1.9  PHOS 3.5 3.6    ABG: No results for input(s): PHART, PCO2ART, PO2ART, HCO3, O2SAT in the last 168 hours.  Liver Function Tests: No results for input(s): AST, ALT, ALKPHOS, BILITOT, PROT, ALBUMIN in the last 168 hours. No results for input(s): LIPASE, AMYLASE in the last 168 hours. No results for input(s): AMMONIA in the last 168 hours.  CBC: Recent Labs  Lab 01/15/20 0544 01/19/20 0613  WBC 10.9* 11.7*  HGB 11.8* 12.2*  HCT 38.1* 38.4*  MCV 93.8 91.0  PLT 414* 346    Cardiac Enzymes: No results for input(s): CKTOTAL, CKMB, CKMBINDEX, TROPONINI in the last 168 hours.  BNP  (last 3 results) No results for input(s): BNP in the last 8760 hours.  ProBNP (last 3 results) No results for input(s): PROBNP in the last 8760 hours.  Radiological Exams: No results found.  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 Acute on chronic respiratory failure with hypoxia patient right now is on full support on assist control mode has been on 28% FiO2 good saturations noted 2. MSSA pneumonia treated resolving 3. Tracheostomy remains in place 4. Delirium still very confused 5. Cerebellar infarction no change   I have personally seen and evaluated the patient, evaluated laboratory and imaging results, formulated the assessment and plan and placed orders. The Patient requires high complexity decision making with multiple systems involvement.  Rounds were done with the Respiratory Therapy Director and Staff therapists and discussed with nursing staff also.  Yevonne Pax, MD [facility] Pulmonary Critical Care Medicine Sleep Medicine

## 2020-01-20 DIAGNOSIS — J15211 Pneumonia due to Methicillin susceptible Staphylococcus aureus: Secondary | ICD-10-CM | POA: Diagnosis not present

## 2020-01-20 DIAGNOSIS — J9621 Acute and chronic respiratory failure with hypoxia: Secondary | ICD-10-CM | POA: Diagnosis not present

## 2020-01-20 DIAGNOSIS — I63549 Cerebral infarction due to unspecified occlusion or stenosis of unspecified cerebellar artery: Secondary | ICD-10-CM | POA: Diagnosis not present

## 2020-01-20 DIAGNOSIS — F05 Delirium due to known physiological condition: Secondary | ICD-10-CM | POA: Diagnosis not present

## 2020-01-20 NOTE — Progress Notes (Signed)
Pulmonary Critical Care Medicine [facility] [place]   PULMONARY CRITICAL CARE SERVICE  PROGRESS NOTE  Date of Service: 01/20/2020  Melvin Howard  FIE:[id number]  DOB: 04-22-1946   DOA: 12-26-19  Referring Physician: Carron Curie, MD  HPI: Melvin Howard is a 74 y.o. male seen for follow up of Acute on Chronic Respiratory Failure.  Patient currently is on assist control mode has been on 28% FiO2 with good saturations.  Apparently family is still deciding on not making him comfort care  Medications: Reviewed on Rounds  Physical Exam:  Vitals: Temperature 97.0 pulse 76 respiratory rate 22 pressure is 167/98 saturations 97%  Ventilator Settings on assist control FiO2 28% tidal volume 450 PEEP 5  . General: Comfortable at this time . Eyes: Grossly normal lids, irises & conjunctiva . ENT: grossly tongue is normal . Neck: no obvious mass . Cardiovascular: S1 S2 normal no gallop . Respiratory: No rhonchi coarse breath sounds . Abdomen: soft . Skin: no rash seen on limited exam . Musculoskeletal: not rigid . Psychiatric:unable to assess . Neurologic: no seizure no involuntary movements         Lab Data:   Basic Metabolic Panel: Recent Labs  Lab 01/15/20 0544 01/19/20 0613  NA 138 137  K 4.3 4.2  CL 102 95*  CO2 26 28  GLUCOSE 109* 137*  BUN 25* 26*  CREATININE 0.56* 0.60*  CALCIUM 8.8* 8.8*  MG 1.9 1.9  PHOS 3.5 3.6    ABG: No results for input(s): PHART, PCO2ART, PO2ART, HCO3, O2SAT in the last 168 hours.  Liver Function Tests: No results for input(s): AST, ALT, ALKPHOS, BILITOT, PROT, ALBUMIN in the last 168 hours. No results for input(s): LIPASE, AMYLASE in the last 168 hours. No results for input(s): AMMONIA in the last 168 hours.  CBC: Recent Labs  Lab 01/15/20 0544 01/19/20 0613  WBC 10.9* 11.7*  HGB 11.8* 12.2*  HCT 38.1* 38.4*  MCV 93.8 91.0  PLT 414* 346    Cardiac Enzymes: No results for input(s): CKTOTAL, CKMB,  CKMBINDEX, TROPONINI in the last 168 hours.  BNP (last 3 results) No results for input(s): BNP in the last 8760 hours.  ProBNP (last 3 results) No results for input(s): PROBNP in the last 8760 hours.  Radiological Exams: No results found.  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 Acute on chronic respiratory failure with hypoxia patient is on assist control mode currently FiO2 of 28% with a PEEP of 5 2. MSSA pneumonia treated we will continue with supportive care 3. Delirium no change 4. Tracheostomy remains in place cerebellar infarction no improvement whatsoever   I have personally seen and evaluated the patient, evaluated laboratory and imaging results, formulated the assessment and plan and placed orders. The Patient requires high complexity decision making with multiple systems involvement.  Rounds were done with the Respiratory Therapy Director and Staff therapists and discussed with nursing staff also.  Yevonne Pax, MD [facility] Pulmonary Critical Care Medicine Sleep Medicine

## 2020-01-21 DIAGNOSIS — I63549 Cerebral infarction due to unspecified occlusion or stenosis of unspecified cerebellar artery: Secondary | ICD-10-CM | POA: Diagnosis not present

## 2020-01-21 DIAGNOSIS — J9621 Acute and chronic respiratory failure with hypoxia: Secondary | ICD-10-CM | POA: Diagnosis not present

## 2020-01-21 DIAGNOSIS — F05 Delirium due to known physiological condition: Secondary | ICD-10-CM | POA: Diagnosis not present

## 2020-01-21 DIAGNOSIS — J15211 Pneumonia due to Methicillin susceptible Staphylococcus aureus: Secondary | ICD-10-CM | POA: Diagnosis not present

## 2020-01-21 NOTE — Progress Notes (Addendum)
Pulmonary Critical Care Medicine [facility] [place]   PULMONARY CRITICAL CARE SERVICE  PROGRESS NOTE  Date of Service: 01/21/2020  Melvin Howard  KVQ:[id number]  DOB: 1946-03-25   DOA: 12/13/2019  Referring Physician: Carron Curie, MD  HPI: Melvin Howard is a 74 y.o. male seen for follow up of Acute on Chronic Respiratory Failure.  Patient continue full support on the ventilator at this time assist-control mode 15 with an FiO2 of 20% there is still talking about comfort care with family at this time.  Medications: Reviewed on Rounds  Physical Exam:  Vitals: Pulse 79 respirations 18 BP 124/66 O2 sat 99% temp 97.3  Ventilator Settings ventilator mode AC VC rate 18 tidal volume 450 PEEP of 5 FiO2 28%  . General: Comfortable at this time . Eyes: Grossly normal lids, irises & conjunctiva . ENT: grossly tongue is normal . Neck: no obvious mass . Cardiovascular: S1 S2 normal no gallop . Respiratory: No rales or rhonchi noted . Abdomen: soft . Skin: no rash seen on limited exam . Musculoskeletal: not rigid . Psychiatric:unable to assess . Neurologic: no seizure no involuntary movements         Lab Data:   Basic Metabolic Panel: Recent Labs  Lab 01/15/20 0544 01/19/20 0613  NA 138 137  K 4.3 4.2  CL 102 95*  CO2 26 28  GLUCOSE 109* 137*  BUN 25* 26*  CREATININE 0.56* 0.60*  CALCIUM 8.8* 8.8*  MG 1.9 1.9  PHOS 3.5 3.6    ABG: No results for input(s): PHART, PCO2ART, PO2ART, HCO3, O2SAT in the last 168 hours.  Liver Function Tests: No results for input(s): AST, ALT, ALKPHOS, BILITOT, PROT, ALBUMIN in the last 168 hours. No results for input(s): LIPASE, AMYLASE in the last 168 hours. No results for input(s): AMMONIA in the last 168 hours.  CBC: Recent Labs  Lab 01/15/20 0544 01/19/20 0613  WBC 10.9* 11.7*  HGB 11.8* 12.2*  HCT 38.1* 38.4*  MCV 93.8 91.0  PLT 414* 346    Cardiac Enzymes: No results for input(s): CKTOTAL, CKMB,  CKMBINDEX, TROPONINI in the last 168 hours.  BNP (last 3 results) No results for input(s): BNP in the last 8760 hours.  ProBNP (last 3 results) No results for input(s): PROBNP in the last 8760 hours.  Radiological Exams: No results found.  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 Acute on chronic respiratory failure with hypoxia continue on full support on ventilator at this time assist-control rate 15 tidal volume 450 PEEP 5 FiO2 28 supportive measures and pulmonary toilet. 2. MSSA pneumonia treated we will continue with supportive care 3. Delirium no change 4. Tracheostomy remains in place cerebellar infarction no improvement whatsoever   I have personally seen and evaluated the patient, evaluated laboratory and imaging results, formulated the assessment and plan and placed orders. The Patient requires high complexity decision making with multiple systems involvement.  Rounds were done with the Respiratory Therapy Director and Staff therapists and discussed with nursing staff also.  Yevonne Pax, MD [facility] Pulmonary Critical Care Medicine Sleep Medicine

## 2020-01-22 DIAGNOSIS — J15211 Pneumonia due to Methicillin susceptible Staphylococcus aureus: Secondary | ICD-10-CM | POA: Diagnosis not present

## 2020-01-22 DIAGNOSIS — I63549 Cerebral infarction due to unspecified occlusion or stenosis of unspecified cerebellar artery: Secondary | ICD-10-CM | POA: Diagnosis not present

## 2020-01-22 DIAGNOSIS — F05 Delirium due to known physiological condition: Secondary | ICD-10-CM | POA: Diagnosis not present

## 2020-01-22 DIAGNOSIS — J9621 Acute and chronic respiratory failure with hypoxia: Secondary | ICD-10-CM | POA: Diagnosis not present

## 2020-01-22 NOTE — Progress Notes (Signed)
Pulmonary Critical Care Medicine [facility] [place]   PULMONARY CRITICAL CARE SERVICE  PROGRESS NOTE  Date of Service: 01/22/2020  Melvin Howard  MPN:[id number]  DOB: [birth year]   DOA: Dec 27, 2019  Referring Physician: Carron Curie, MD  HPI: Melvin Howard is a 74 y.o. male seen for follow up of Acute on Chronic Respiratory Failure.  Had a very lengthy discussion with the patient's daughter at the bedside.  She states that the family is ready to make him comfort care however they want everyone who can to be able to see him prior to withdrawing the ventilator.  Medications: Reviewed on Rounds  Physical Exam:  Vitals: Temperature is 96.2 pulse 68 respiratory rate 17 blood pressure 124/70 saturations 97%  Ventilator Settings on the ventilator and full support assist control mode  . General: Comfortable at this time . Eyes: Grossly normal lids, irises & conjunctiva . ENT: grossly tongue is normal . Neck: no obvious mass . Cardiovascular: S1 S2 normal no gallop . Respiratory: No rhonchi no rales are noted at this time . Abdomen: soft . Skin: no rash seen on limited exam . Musculoskeletal: not rigid . Psychiatric:unable to assess . Neurologic: no seizure no involuntary movements         Lab Data:   Basic Metabolic Panel: Recent Labs  Lab 01/19/20 0613  NA 137  K 4.2  CL 95*  CO2 28  GLUCOSE 137*  BUN 26*  CREATININE 0.60*  CALCIUM 8.8*  MG 1.9  PHOS 3.6    ABG: No results for input(s): PHART, PCO2ART, PO2ART, HCO3, O2SAT in the last 168 hours.  Liver Function Tests: No results for input(s): AST, ALT, ALKPHOS, BILITOT, PROT, ALBUMIN in the last 168 hours. No results for input(s): LIPASE, AMYLASE in the last 168 hours. No results for input(s): AMMONIA in the last 168 hours.  CBC: Recent Labs  Lab 01/19/20 0613  WBC 11.7*  HGB 12.2*  HCT 38.4*  MCV 91.0  PLT 346    Cardiac Enzymes: No results for input(s): CKTOTAL, CKMB, CKMBINDEX,  TROPONINI in the last 168 hours.  BNP (last 3 results) No results for input(s): BNP in the last 8760 hours.  ProBNP (last 3 results) No results for input(s): PROBNP in the last 8760 hours.  Radiological Exams: No results found.  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 Acute on chronic respiratory failure hypoxia plan is to continue on the ventilator for now eventually once family gives [place] to go ahead and patient will be withdrawn from the ventilator and comfort measures have already been started 2. MSSA pneumonia treated we'll continue supportive care 3. Delirium no change 4. Cerebellar infarction no change 5. Tracheostomy remains in place   I have personally seen and evaluated the patient, evaluated laboratory and imaging results, formulated the assessment and plan and placed orders. The Patient requires high complexity decision making with multiple systems involvement.  Rounds were done with the Respiratory Therapy Director and Staff therapists and discussed with nursing staff also.  Yevonne Pax, MD [facility] Pulmonary Critical Care Medicine Sleep Medicine

## 2020-01-26 DEATH — deceased

## 2020-03-08 IMAGING — DX DG CHEST 1V PORT
1 series · 1 of 1 positions shown · non-contrast
Comparison: December 04, 2019.

CLINICAL DATA: Pneumonia.

EXAM:
PORTABLE CHEST 1 VIEW

[chest]
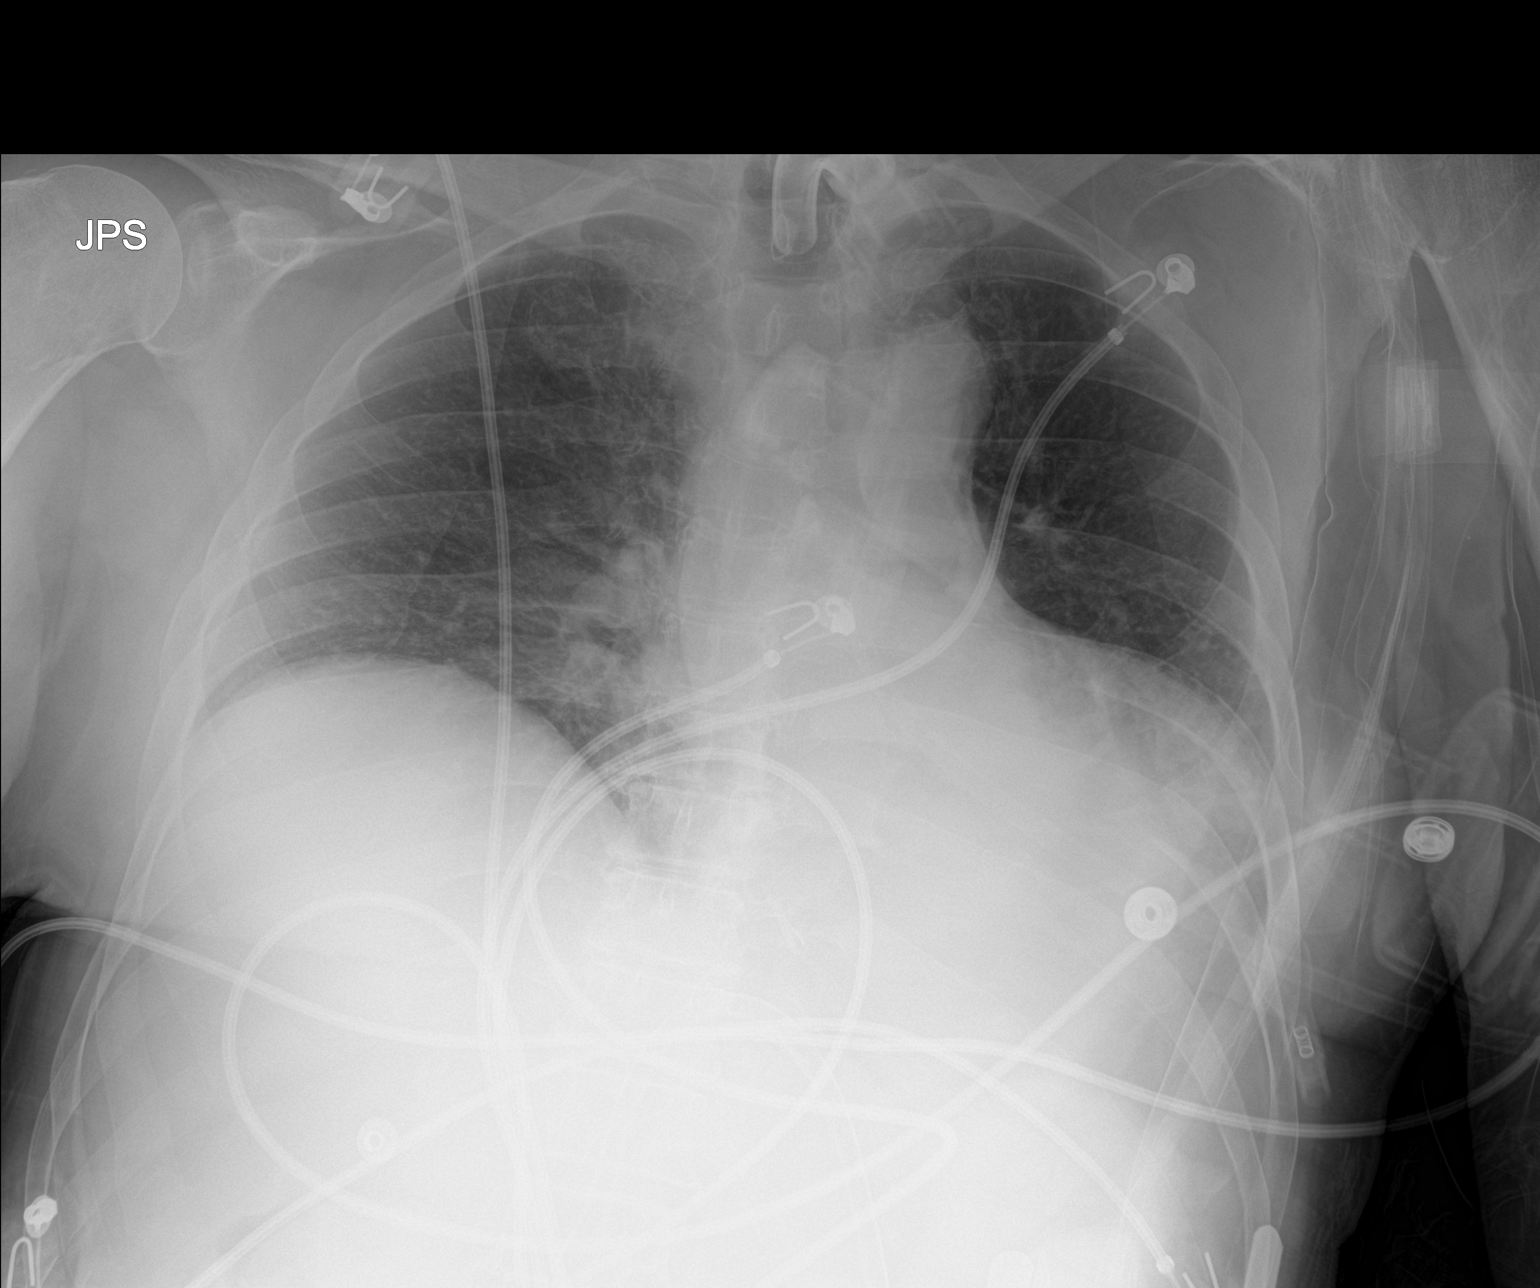

[1 of 1 positions shown; findings below may reference images not displayed]

FINDINGS: Stable cardiomegaly. Tracheostomy tube appears normal. Right lung is
clear. Possible mild left basilar subsegmental atelectasis is noted.
No pneumothorax is noted. Bony thorax is unremarkable.
IMPRESSION: Possible mild left basilar subsegmental atelectasis. Stable
tracheostomy tube.

## 2020-03-25 IMAGING — DX DG CHEST 1V PORT
1 series · 1 of 1 positions shown · non-contrast
Comparison: 12/30/2019 prior exams

CLINICAL DATA: Shortness of breath and pneumonia follow-up

EXAM:
PORTABLE CHEST 1 VIEW

[chest ap]
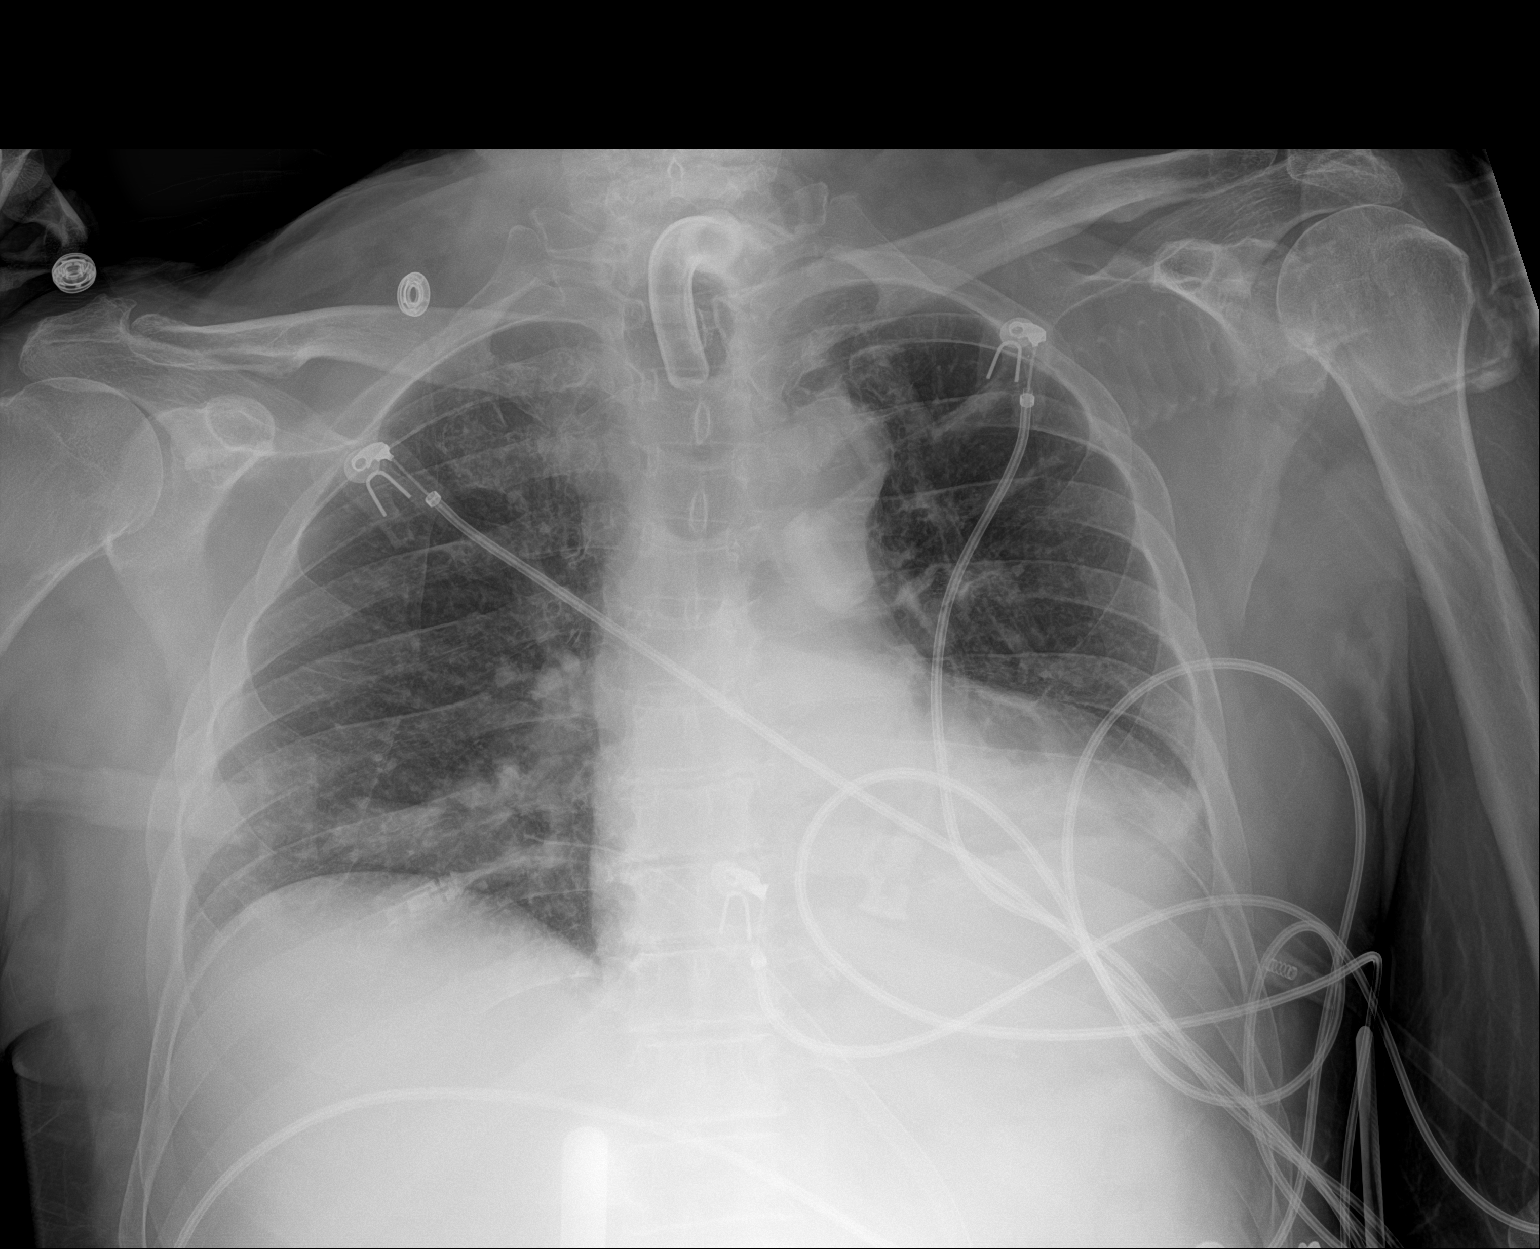

[1 of 1 positions shown; findings below may reference images not displayed]

FINDINGS: Tracheostomy tube and LEFT basilar atelectasis/opacity again noted.

This is a low volume film.

No pneumothorax or definite pleural effusion.

There has been little interval change since the prior study.
IMPRESSION: Unchanged appearance of the chest with LEFT basilar
atelectasis/opacity.

## 2020-03-28 IMAGING — DX DG CHEST 1V PORT
1 series · 1 of 1 positions shown · non-contrast
Comparison: 01/02/2019

CLINICAL DATA: Fever and aspiration

EXAM:
PORTABLE CHEST 1 VIEW

[chest ap]
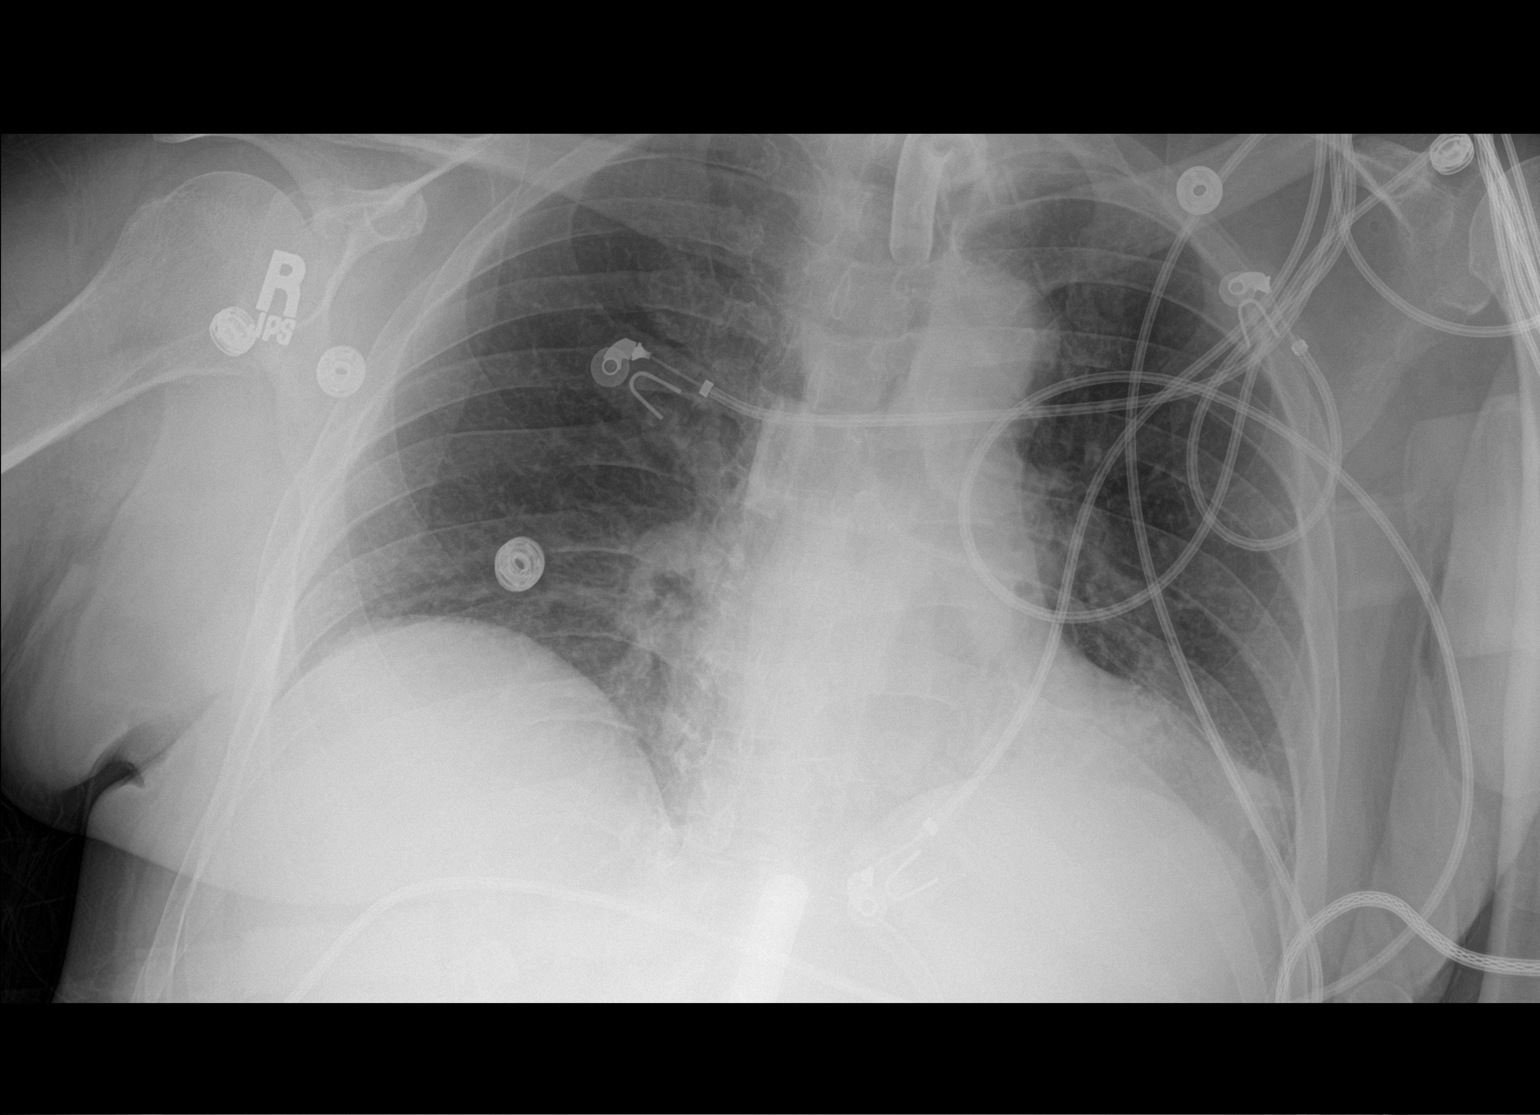

[1 of 1 positions shown; findings below may reference images not displayed]

FINDINGS: Tracheostomy tube tip is above the carina. Normal heart size. No
pleural effusion or edema. Decreased lung volumes. Left basilar
opacity is again noted which may represent atelectasis or pneumonia.
Remaining portions of lungs are clear.
IMPRESSION: Persistent left basilar opacity which may represent atelectasis or
pneumonia.
